# Patient Record
Sex: Male | Born: 1960 | Race: White | Hispanic: No | Marital: Married | Smoking: Never smoker
Health system: Southern US, Community
[De-identification: ages and names within clinical notes are randomized; demographics above are authoritative.]

## PROBLEM LIST (undated history)

## (undated) DIAGNOSIS — E785 Hyperlipidemia, unspecified: Secondary | ICD-10-CM

## (undated) DIAGNOSIS — B269 Mumps without complication: Secondary | ICD-10-CM

## (undated) DIAGNOSIS — L409 Psoriasis, unspecified: Secondary | ICD-10-CM

## (undated) DIAGNOSIS — L570 Actinic keratosis: Secondary | ICD-10-CM

## (undated) DIAGNOSIS — I10 Essential (primary) hypertension: Secondary | ICD-10-CM

## (undated) DIAGNOSIS — M1990 Unspecified osteoarthritis, unspecified site: Secondary | ICD-10-CM

## (undated) DIAGNOSIS — B059 Measles without complication: Secondary | ICD-10-CM

## (undated) DIAGNOSIS — B019 Varicella without complication: Secondary | ICD-10-CM

## (undated) DIAGNOSIS — G4730 Sleep apnea, unspecified: Secondary | ICD-10-CM

## (undated) DIAGNOSIS — M109 Gout, unspecified: Secondary | ICD-10-CM

## (undated) HISTORY — DX: Measles without complication: B05.9

## (undated) HISTORY — PX: CPAP: SHX449

## (undated) HISTORY — DX: Actinic keratosis: L57.0

## (undated) HISTORY — DX: Psoriasis, unspecified: L40.9

## (undated) HISTORY — DX: Mumps without complication: B26.9

## (undated) HISTORY — DX: Unspecified osteoarthritis, unspecified site: M19.90

## (undated) HISTORY — DX: Essential (primary) hypertension: I10

## (undated) HISTORY — PX: OTHER SURGICAL HISTORY: SHX169

## (undated) HISTORY — PX: KNEE SURGERY: SHX244

## (undated) HISTORY — DX: Hyperlipidemia, unspecified: E78.5

## (undated) HISTORY — DX: Varicella without complication: B01.9

---

## 2004-05-17 ENCOUNTER — Ambulatory Visit: Payer: Self-pay | Admitting: Family Medicine

## 2017-08-17 ENCOUNTER — Other Ambulatory Visit: Payer: Self-pay | Admitting: Medical

## 2017-08-17 ENCOUNTER — Encounter: Payer: Self-pay | Admitting: Medical

## 2017-08-17 ENCOUNTER — Ambulatory Visit: Payer: Self-pay | Admitting: Medical

## 2017-08-17 VITALS — BP 161/85 | HR 94 | Temp 100.2°F | Resp 18 | Wt 268.8 lb

## 2017-08-17 DIAGNOSIS — L4050 Arthropathic psoriasis, unspecified: Secondary | ICD-10-CM

## 2017-08-17 MED ORDER — TRAMADOL HCL 50 MG PO TABS: 50.0000 mg | ORAL_TABLET | Freq: Three times a day (TID) | ORAL | 0 refills | Status: DC | PRN

## 2017-08-17 MED ORDER — PREDNISONE 10 MG PO TABS: ORAL_TABLET | ORAL | 0 refills | Status: DC

## 2017-08-17 NOTE — Patient Instructions (Signed)
Psoriasis Psoriasis is a long-term (chronic) condition of skin inflammation. It occurs because your immune system causes skin cells to form too quickly. As a result, too many skin cells grow and create raised, red patches (plaques) that look silvery on your skin. Plaques may appear anywhere on your body. They can be any size or shape. Psoriasis can come and go. The condition varies from mild to very severe. It cannot be passed from one person to another (not contagious). What are the causes? The cause of psoriasis is not known, but certain factors can make the condition worse. These include:  Damage or trauma to the skin, such as cuts, scrapes, sunburn, and dryness.  Lack of sunlight.  Certain medicines.  Alcohol.  Tobacco use.  Stress.  Infections caused by bacteria or viruses.  What increases the risk? This condition is more likely to develop in:  People with a family history of psoriasis.  People who are Caucasian.  People who are between the ages of 15-30 and 50-60 years old.  What are the signs or symptoms? There are five different types of psoriasis. You can have more than one type of psoriasis during your life. Types are:  Plaque.  Guttate.  Inverse.  Pustular.  Erythrodermic.  Each type of psoriasis has different symptoms.  Plaque psoriasis symptoms include red, raised plaques with a silvery white coating (scale). These plaques may be itchy. Your nails may be pitted and crumbly or fall off.  Guttate psoriasis symptoms include small red spots that often show up on your trunk, arms, and legs. These spots may develop after you have been sick, especially with strep throat.  Inverse psoriasis symptoms include plaques in your underarm area, under your breasts, or on your genitals, groin, or buttocks.  Pustular psoriasis symptoms include pus-filled bumps that are painful, red, and swollen on the palms of your hands or the soles of your feet. You also may feel  exhausted, feverish, weak, or have no appetite.  Erythrodermic psoriasis symptoms include bright red skin that may look burned. You may have a fast heartbeat and a body temperature that is too high or too low. You may be itchy or in pain.  How is this diagnosed? Your health care provider may suspect psoriasis based on your symptoms and family history. Your health care provider will also do a physical exam. This may include a procedure to remove a tissue sample (biopsy) for testing. You may also be referred to a health care provider who specializes in skin diseases (dermatologist). How is this treated? There is no cure for this condition, but treatment can help manage it. Goals of treatment include:  Helping your skin heal.  Reducing itching and inflammation.  Slowing the growth of new skin cells.  Helping your immune system respond better to your skin.  Treatment varies, depending on the severity of your condition. Treatment may include:  Creams or ointments.  Ultraviolet ray exposure (light therapy). This may include natural sunlight or light therapy in a medical office.  Medicines (systemic therapy). These medicines can help your body better manage skin cell turnover and inflammation. They may be used along with light therapy or ointments. You may also get antibiotic medicines if you have an infection.  Follow these instructions at home: Skin Care  Moisturize your skin as needed. Only use moisturizers that have been approved by your health care provider.  Apply cool compresses to the affected areas.  Do not scratch your skin. Lifestyle   Do not   use tobacco products. This includes cigarettes, chewing tobacco, and e-cigarettes. If you need help quitting, ask your health care provider.  Drink little or no alcohol.  Try techniques for stress reduction, such as meditation or yoga.  Get exposure to the sun as told by your health care provider. Do not get sunburned.  Consider  joining a psoriasis support group. Medicines  Take or use over-the-counter and prescription medicines only as told by your health care provider.  If you were prescribed an antibiotic, take or use it as told by your health care provider. Do not stop taking the antibiotic even if your condition starts to improve. General instructions  Keep a journal to help track what triggers an outbreak. Try to avoid any triggers.  See a counselor or social worker if feelings of sadness, frustration, and hopelessness about your condition are interfering with your work and relationships.  Keep all follow-up visits as told by your health care provider. This is important. Contact a health care provider if:  Your pain gets worse.  You have increasing redness or warmth in the affected areas.  You have new or worsening pain or stiffness in your joints.  Your nails start to break easily or pull away from the nail bed.  You have a fever.  You feel depressed. This information is not intended to replace advice given to you by your health care provider. Make sure you discuss any questions you have with your health care provider. Document Released: 02/15/2000 Document Revised: 07/26/2015 Document Reviewed: 07/05/2014 Elsevier Interactive Patient Education  2018 [employer]  

## 2017-08-17 NOTE — Progress Notes (Addendum)
Labs wnl.   Subjective:    Patient ID: Omar Wilson, male    DOB: 28-Feb-1961, 57 y.o.   MRN: [id number]  HPI 57 yo male starated with foot pain bilaterally at great toe Joint. Thursday night  Both feet started at the same.  Has previous history of  Gout  In the right great toe  one prior history and  Left elbow  one prior episode times once.. Ate fried oysters and shellfish/ crab dip and drinking beer "I drink socially".[employer]  5 miles on Thursday.  Worse this morining  9.5 /10 if walking   Felt he got dehydated " I can just tell". Ran 5 miles on Thursday next morning they were painful and swollen.  If he is on his Humira he does not usually get episode of gout. He has a history of Psoriatic arthritis.   Review of Systems  Constitutional: Positive for chills (using ice pak on feet. ). Negative for fever.  HENT: Negative for congestion, ear pain and sore throat.   Eyes: Negative for discharge and visual disturbance.  Respiratory: Negative for shortness of breath.   Cardiovascular: Negative for chest pain.  Gastrointestinal: Negative for diarrhea, nausea and vomiting.  Endocrine: Negative for polydipsia, polyphagia and polyuria.  Genitourinary: Negative for dysuria and hematuria.  Musculoskeletal: Positive for arthralgias and joint swelling.  Skin: Positive for color change (erythema of feet).  Allergic/Immunologic: Negative for environmental allergies and food allergies.  Neurological: Negative for dizziness, syncope, light-headedness and headaches.  Hematological: Negative for adenopathy.  Psychiatric/Behavioral: Negative for behavioral problems, self-injury and suicidal ideas.   Out of Humira x 1 month. Last labs  X 1 year.      Objective:   Physical Exam  57 yo male enters walking gingerly into the room. He just moved back to  [place] from [place] (5 yrs ago). Swelling to bilateral Great metatarsal joints, erythema and warmtth. He left his last  Injection of  Humira at his old house.  He has been drinking beer and eating shellfish in celebrations.    Assessment & Plan:  Gout bilateral feet Left>Right Out of Humira Will refer to Rheumatologist.  Ordered CBC Met C and Uric acid labs. Cancelled lab core orders and did a lab order sheet because he does not have [employer]. He says he is going to [facility] at [facility] [address].  Berdine Addison ordered this encounter  Medications  . predniSONE (DELTASONE) 10 MG tablet    Sig: Take 6Tablets by mouth  Today then 5 tablets tomorrow , then one tablet less each day thereafter, take with food.    Dispense:  21 tablet    Refill:  0  . traMADol (ULTRAM) 50 MG tablet    Sig: Take 1 tablet (50 mg total) by mouth every 8 (eight) hours as needed.    Dispense:  15 tablet    Refill:  0  Did not print  So hand wrote for Tramadol prescription 50 mg one every 8 hours as needed for pain  #15 no refill Copy for chart.  Declines crutches or work note.Elevate and wear loose shoes.  Patient verbalizes understanding and has no questions at [place]. He states because it has gotten worse he has stopped beer intake since yesterday. Return to the clinic in 3-5 days if not improving, elevate feet and limit walking.  Patient verbalizes understanding and has no questions at discharge.

## 2017-08-18 ENCOUNTER — Ambulatory Visit: Payer: Self-pay | Admitting: Medical

## 2017-08-18 NOTE — Addendum Note (Signed)
Addended by: Daryll Drown R on: 08/18/2017 04:38 PM   Modules accepted: Orders

## 2017-08-19 ENCOUNTER — Telehealth: Payer: Self-pay | Admitting: Medical

## 2017-08-19 LAB — COMPREHENSIVE METABOLIC PANEL
ALBUMIN: 4.3 g/dL (ref 3.5–5.5)
ALT: 19 IU/L (ref 0–44)
AST: 18 IU/L (ref 0–40)
Albumin/Globulin Ratio: 1.5 (ref 1.2–2.2)
Alkaline Phosphatase: 97 IU/L (ref 39–117)
BILIRUBIN TOTAL: 0.8 mg/dL (ref 0.0–1.2)
BUN / CREAT RATIO: 11 (ref 9–20)
BUN: 12 mg/dL (ref 6–24)
CO2: 23 mmol/L (ref 20–29)
Calcium: 9.5 mg/dL (ref 8.7–10.2)
Chloride: 101 mmol/L (ref 96–106)
Creatinine, Ser: 1.13 mg/dL (ref 0.76–1.27)
GFR calc Af Amer: 82 mL/min/{1.73_m2} (ref >59)
GFR calc non Af Amer: 71 mL/min/{1.73_m2} (ref >59)
GLOBULIN, TOTAL: 2.9 g/dL (ref 1.5–4.5)
Glucose: 88 mg/dL (ref 65–99)
POTASSIUM: 4.6 mmol/L (ref 3.5–5.2)
SODIUM: 142 mmol/L (ref 134–144)
Total Protein: 7.2 g/dL (ref 6.0–8.5)

## 2017-08-19 LAB — CBC WITH DIFFERENTIAL/PLATELET
BASOS ABS: 0 10*3/uL (ref 0.0–0.2)
Basos: 0 %
EOS (ABSOLUTE): 0.1 10*3/uL (ref 0.0–0.4)
Eos: 1 %
HEMATOCRIT: 46.8 % (ref 37.5–51.0)
Hemoglobin: 16.2 g/dL (ref 13.0–17.7)
IMMATURE GRANULOCYTES: 0 %
Immature Grans (Abs): 0 10*3/uL (ref 0.0–0.1)
LYMPHS ABS: 1.4 10*3/uL (ref 0.7–3.1)
Lymphs: 16 %
MCH: 33.3 pg — ABNORMAL HIGH (ref 26.6–33.0)
MCHC: 34.6 g/dL (ref 31.5–35.7)
MCV: 96 fL (ref 79–97)
MONOS ABS: 0.7 10*3/uL (ref 0.1–0.9)
Monocytes: 8 %
NEUTROS PCT: 75 %
Neutrophils Absolute: 6.5 10*3/uL (ref 1.4–7.0)
PLATELETS: 307 10*3/uL (ref 150–450)
RBC: 4.86 x10E6/uL (ref 4.14–5.80)
RDW: 14.7 % (ref 12.3–15.4)
WBC: 8.7 10*3/uL (ref 3.4–10.8)

## 2017-08-19 LAB — URIC ACID: URIC ACID: 6.6 mg/dL (ref 3.7–8.6)

## 2017-08-19 NOTE — Telephone Encounter (Signed)
Called patient ot review labs. Uric acid  6.6.  Will offer colchicine on a short term bases per Dr. Marlan Palau recommendations Asked patient to call office.

## 2018-02-25 ENCOUNTER — Other Ambulatory Visit: Payer: Self-pay | Admitting: Internal Medicine

## 2018-02-25 DIAGNOSIS — M25461 Effusion, right knee: Secondary | ICD-10-CM

## 2018-03-01 ENCOUNTER — Other Ambulatory Visit
Admission: RE | Admit: 2018-03-01 | Discharge: 2018-03-01 | Disposition: A | Discharge status: Home / Self Care | Payer: Self-pay | Source: Ambulatory Visit | Attending: Sports Medicine | Admitting: Sports Medicine

## 2018-03-01 DIAGNOSIS — G8929 Other chronic pain: Secondary | ICD-10-CM | POA: Insufficient documentation

## 2018-03-01 DIAGNOSIS — M25561 Pain in right knee: Secondary | ICD-10-CM | POA: Insufficient documentation

## 2018-03-01 DIAGNOSIS — M25461 Effusion, right knee: Secondary | ICD-10-CM | POA: Insufficient documentation

## 2018-03-01 LAB — SYNOVIAL CELL COUNT + DIFF, W/ CRYSTALS
EOSINOPHILS-SYNOVIAL: 0 %
Lymphocytes-Synovial Fld: 20 %
Monocyte-Macrophage-Synovial Fluid: 11 %
NEUTROPHIL, SYNOVIAL: 69 %
WBC, SYNOVIAL: 1438 /mm3 — AB (ref 0–200)

## 2018-03-05 LAB — BODY FLUID CULTURE: CULTURE: NO GROWTH

## 2018-03-07 ENCOUNTER — Other Ambulatory Visit: Payer: Self-pay

## 2018-03-08 ENCOUNTER — Ambulatory Visit
Admission: RE | Admit: 2018-03-08 | Discharge: 2018-03-08 | Disposition: A | Discharge status: Home / Self Care | Payer: BC Managed Care – PPO | Source: Ambulatory Visit | Attending: Internal Medicine | Admitting: Internal Medicine

## 2018-03-08 DIAGNOSIS — M25461 Effusion, right knee: Secondary | ICD-10-CM

## 2018-09-03 ENCOUNTER — Other Ambulatory Visit: Payer: Self-pay | Admitting: *Deleted

## 2018-09-03 DIAGNOSIS — Z20822 Contact with and (suspected) exposure to covid-19: Secondary | ICD-10-CM

## 2018-09-06 ENCOUNTER — Other Ambulatory Visit: Payer: Self-pay

## 2018-09-06 DIAGNOSIS — Z20822 Contact with and (suspected) exposure to covid-19: Secondary | ICD-10-CM

## 2018-09-11 LAB — NOVEL CORONAVIRUS, NAA: SARS-CoV-2, NAA: NOT DETECTED

## 2019-01-05 ENCOUNTER — Ambulatory Visit: Payer: Self-pay

## 2019-01-05 ENCOUNTER — Other Ambulatory Visit: Payer: Self-pay

## 2019-01-05 DIAGNOSIS — Z23 Encounter for immunization: Secondary | ICD-10-CM

## 2019-06-22 ENCOUNTER — Ambulatory Visit: Payer: BC Managed Care – PPO | Admitting: Dermatology

## 2019-06-22 ENCOUNTER — Other Ambulatory Visit: Payer: Self-pay

## 2019-06-22 DIAGNOSIS — D229 Melanocytic nevi, unspecified: Secondary | ICD-10-CM

## 2019-06-22 DIAGNOSIS — L570 Actinic keratosis: Secondary | ICD-10-CM | POA: Diagnosis not present

## 2019-06-22 DIAGNOSIS — L820 Inflamed seborrheic keratosis: Secondary | ICD-10-CM

## 2019-06-22 DIAGNOSIS — L578 Other skin changes due to chronic exposure to nonionizing radiation: Secondary | ICD-10-CM

## 2019-06-22 DIAGNOSIS — L814 Other melanin hyperpigmentation: Secondary | ICD-10-CM

## 2019-06-22 DIAGNOSIS — Z1283 Encounter for screening for malignant neoplasm of skin: Secondary | ICD-10-CM

## 2019-06-22 DIAGNOSIS — L409 Psoriasis, unspecified: Secondary | ICD-10-CM | POA: Diagnosis not present

## 2019-06-22 DIAGNOSIS — L821 Other seborrheic keratosis: Secondary | ICD-10-CM | POA: Diagnosis not present

## 2019-06-22 MED ORDER — ENSTILAR 0.005-0.064 % EX FOAM: 1.0000 "application " | Freq: Two times a day (BID) | CUTANEOUS | 3 refills | Status: DC

## 2019-06-22 NOTE — Progress Notes (Signed)
Follow-Up Visit   Subjective  Omar Wilson is a 59 y.o. male who presents for the following: Annual Exam (yearly body exam ), Actinic Keratosis (3 months f/u Ak on face, arms, past treatment LN2, [place] ), and Psoriasis (f/u on Psoriasis on the lower legs, using Enstilar foam with a good response). Patient presents for skin cancer screening and mole check and total-body skin exam today.  The following portions of the chart were reviewed this encounter and updated as appropriate:  Tobacco  Allergies  Meds  Problems  Med Hx  Surg Hx  Fam Hx      Review of Systems:  No other skin or systemic complaints except as noted in HPI or Assessment and Plan.  Objective  Well appearing patient in no apparent distress; mood and affect are within normal limits.  A full examination was performed including scalp, head, eyes, ears, nose, lips, neck, chest, axillae, abdomen, back, buttocks, bilateral upper extremities, bilateral lower extremities, hands, feet, fingers, toes, fingernails, and toenails. All findings within normal limits unless otherwise noted below.  Objective  R mid helix, face, arms (21): Erythematous thin papules/macules with gritty scale.   Objective  arms, hands (16): Erythematous keratotic or waxy stuck-on papule or plaque.   Objective  lower legs: Pink scaly guttate patches of the legs    Assessment & Plan    Skin cancer screening performed today.  Seborrheic Keratoses - Stuck-on, waxy, tan-brown papules and plaques  - Discussed benign etiology and prognosis. - Observe - Call for any changes Melanocytic Nevi - Tan-brown and/or pink-flesh-colored symmetric macules and papules - Benign appearing on exam today - Observation - Call clinic for new or changing moles - Recommend daily use of broad spectrum spf 30+ sunscreen to sun-exposed areas.  Actinic Damage - diffuse scaly erythematous macules with underlying dyspigmentation - Recommend daily broad spectrum  sunscreen SPF 30+ to sun-exposed areas, reapply every 2 hours as needed.  - Call for new or changing lesions. Lentigines - Scattered tan macules - Discussed due to sun exposure - Benign, observe - Call for any changes   AK (actinic keratosis) (21) R mid helix, face, arms  Recheck R mid helix at the next office visit if not gone may consider biopsy.   Destruction of lesion - R mid helix, face, arms Complexity: simple   Destruction method: cryotherapy   Informed consent: discussed and consent obtained   Timeout:  patient name, date of birth, surgical site, and procedure verified Lesion destroyed using liquid nitrogen: Yes   Region frozen until ice ball extended beyond lesion: Yes   Outcome: patient tolerated procedure well with no complications   Post-procedure details: wound care instructions given    Inflamed seborrheic keratosis (16) arms, hands  Destruction of lesion - arms, hands Complexity: simple   Destruction method: cryotherapy   Informed consent: discussed and consent obtained   Timeout:  patient name, date of birth, surgical site, and procedure verified Lesion destroyed using liquid nitrogen: Yes   Region frozen until ice ball extended beyond lesion: Yes   Outcome: patient tolerated procedure well with no complications   Post-procedure details: wound care instructions given    Psoriasis lower legs  Guttate Psoriasis vs ISK  Cont using Enstilar foam qd-bid   May consider biopsy in the future   Ordered Medications: Calcipotriene-Betameth Diprop (ENSTILAR) 0.005-0.064 % FOAM  Return in about 2 months (around 08/22/2019) for Recheck R mid helix,  PDT arms, hands x 4 weeks, PDT face x  6 weeks .   IMarye Round, CMA, am acting as scribe for Sarina Ser, MD .  Documentation: I have reviewed the above documentation for accuracy and completeness, and I agree with the above.  Sarina Ser, MD

## 2019-06-22 NOTE — Patient Instructions (Signed)

## 2019-06-23 ENCOUNTER — Encounter: Payer: Self-pay | Admitting: Dermatology

## 2019-07-20 ENCOUNTER — Ambulatory Visit: Payer: BC Managed Care – PPO

## 2019-07-20 ENCOUNTER — Other Ambulatory Visit: Payer: Self-pay

## 2019-07-20 DIAGNOSIS — L570 Actinic keratosis: Secondary | ICD-10-CM | POA: Diagnosis not present

## 2019-07-20 MED ORDER — AMINOLEVULINIC ACID HCL 20 % EX SOLR
2.0000 "application " | Freq: Once | CUTANEOUS | Status: AC
  Administered 2019-07-20: 708 mg via TOPICAL

## 2019-07-20 NOTE — Patient Instructions (Signed)

## 2019-07-20 NOTE — Progress Notes (Signed)
Patient completed PDT therapy today.  AK (actinic keratosis) (4) Left Forearm - Posterior; Right Forearm - Posterior; Left Dorsal Hand; Right Dorsal Hand  Photodynamic therapy - Left Dorsal Hand, Left Forearm - Posterior, Right Dorsal Hand, Right Forearm - Posterior Procedure discussed: discussed risks, benefits, side effects. and alternatives   Prep: site scrubbed/prepped with acetone   Number of lesions:  Multiple Type of treatment:  Blue light Aminolevulinic Acid (see MAR for details): Levulan Number of Levulan sticks used:  2 Incubation time (minutes):  120 Number of minutes under lamp:  16 Number of seconds under lamp:  40 Cooling:  Floor fan Outcome: patient tolerated procedure well with no complications   Post-procedure details: sunscreen applied    Aminolevulinic Acid HCl 20 % SOLR 708 mg - Left Dorsal Hand, Left Forearm - Posterior, Right Dorsal Hand, Right Forearm - Posterior

## 2019-08-03 ENCOUNTER — Ambulatory Visit: Payer: BC Managed Care – PPO

## 2019-08-03 ENCOUNTER — Other Ambulatory Visit: Payer: Self-pay

## 2019-08-03 DIAGNOSIS — L570 Actinic keratosis: Secondary | ICD-10-CM

## 2019-08-03 MED ORDER — AMINOLEVULINIC ACID HCL 20 % EX SOLR
1.0000 "application " | Freq: Once | CUTANEOUS | Status: AC
  Administered 2019-08-03: 354 mg via TOPICAL

## 2019-08-03 NOTE — Progress Notes (Signed)
Patient completed PDT therapy today.  AK (actinic keratosis) Head - Anterior (Face)  Photodynamic therapy - Head - Anterior (Face) Procedure discussed: discussed risks, benefits, side effects. and alternatives   Prep: site scrubbed/prepped with acetone   Location:  Face Number of lesions:  Multiple Type of treatment:  Blue light Aminolevulinic Acid (see MAR for details): Levulan Number of Levulan sticks used:  1 Incubation time (minutes):  60 Number of minutes under lamp:  16 Number of seconds under lamp:  40 Cooling:  Floor fan Outcome: patient tolerated procedure well with no complications   Post-procedure details: sunscreen applied    Aminolevulinic Acid HCl 20 % SOLR 354 mg - Head - Anterior (Face)

## 2019-08-03 NOTE — Patient Instructions (Signed)

## 2019-09-21 ENCOUNTER — Ambulatory Visit: Payer: BC Managed Care – PPO | Admitting: Dermatology

## 2019-09-21 ENCOUNTER — Other Ambulatory Visit: Payer: Self-pay

## 2019-09-21 DIAGNOSIS — L570 Actinic keratosis: Secondary | ICD-10-CM

## 2019-09-21 DIAGNOSIS — L820 Inflamed seborrheic keratosis: Secondary | ICD-10-CM

## 2019-09-21 DIAGNOSIS — L578 Other skin changes due to chronic exposure to nonionizing radiation: Secondary | ICD-10-CM | POA: Diagnosis not present

## 2019-09-21 NOTE — Progress Notes (Signed)
   Follow-Up Visit   Subjective  Hal H Temme is a 59 y.o. male who presents for the following: Follow-up (Ak follow up LN2 x 21 06/22/2019, PDT to arms and face. Recheck right mid helix).  The following portions of the chart were reviewed this encounter and updated as appropriate:  Tobacco  Allergies  Meds  Problems  Med Hx  Surg Hx  Fam Hx     Review of Systems:  No other skin or systemic complaints except as noted in HPI or Assessment and Plan.  Objective  Well appearing patient in no apparent distress; mood and affect are within normal limits.  A focused examination was performed including face, arms, hands. Relevant physical exam findings are noted in the Assessment and Plan.  Objective  Arms, face, ears (15): Erythematous thin papules/macules with gritty scale.   Objective  Arms, face (9): Erythematous keratotic or waxy stuck-on papule or plaque.    Assessment & Plan    Actinic Damage - diffuse scaly erythematous macules with underlying dyspigmentation - Recommend daily broad spectrum sunscreen SPF 30+ to sun-exposed areas, reapply every 2 hours as needed.  - Call for new or changing lesions.   AK (actinic keratosis) (15) Arms, face, ears  Destruction of lesion - Arms, face, ears Complexity: simple   Destruction method: cryotherapy   Informed consent: discussed and consent obtained   Timeout:  patient name, date of birth, surgical site, and procedure verified Lesion destroyed using liquid nitrogen: Yes   Region frozen until ice ball extended beyond lesion: Yes   Outcome: patient tolerated procedure well with no complications   Post-procedure details: wound care instructions given    Inflamed seborrheic keratosis (9) Arms, face  Destruction of lesion - Arms, face Complexity: simple   Destruction method: cryotherapy   Informed consent: discussed and consent obtained   Timeout:  patient name, date of birth, surgical site, and procedure verified Lesion  destroyed using liquid nitrogen: Yes   Region frozen until ice ball extended beyond lesion: Yes   Outcome: patient tolerated procedure well with no complications   Post-procedure details: wound care instructions given    Return in about 6 months (around 03/23/2020).   I, Ashok Cordia, CMA, am acting as scribe for Sarina Ser, MD .  Documentation: I have reviewed the above documentation for accuracy and completeness, and I agree with the above.  Sarina Ser, MD

## 2019-09-21 NOTE — Patient Instructions (Signed)

## 2019-09-24 ENCOUNTER — Encounter: Payer: Self-pay | Admitting: Dermatology

## 2019-10-03 IMAGING — MR MR KNEE*R* W/O CM
7 series · 37 of 40 positions shown · non-contrast
Comparison: None.

CLINICAL DATA: Right knee pain and swelling for 6 months

EXAM:
MRI OF THE RIGHT KNEE WITHOUT CONTRAST
TECHNIQUE: Multiplanar, multisequence MR imaging of the knee was performed. No
intravenous contrast was administered.

[Series 8: T2 fat-sat · axial · right · 4.0mm · 0.50mm/px · z∈[-92,+61]mm · 5 of 36 slices shown (1 of 3)]
[im 1/36]
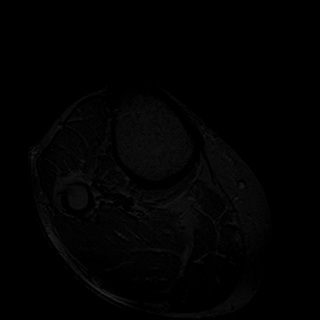
[im 9/36]
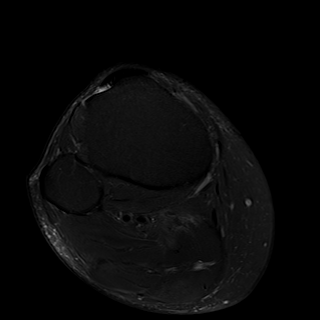
[im 18/36]
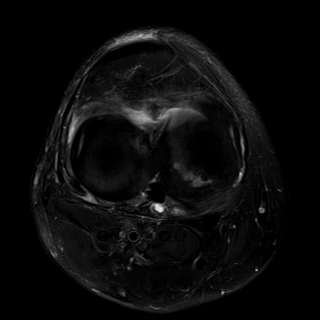
[im 27/36]
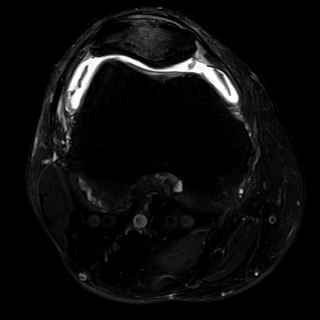
[im 36/36]
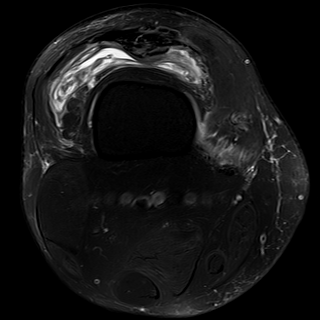

[Series 9: T2 fat-sat · coronal · right · 4.0mm · 0.39mm/px · 6 of 31 slices shown (2 of 3)]
[im 1/31]
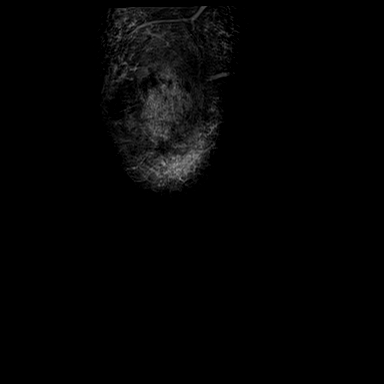
[im 7/31]
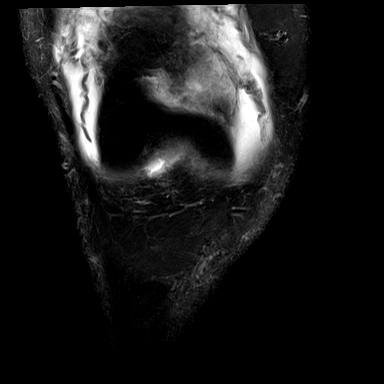
[im 13/31]
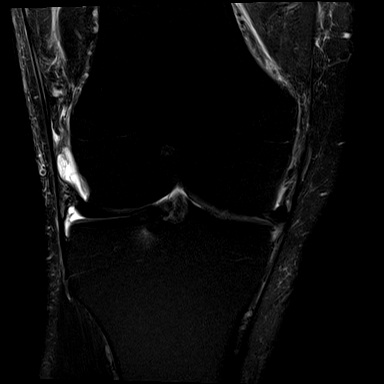
[im 19/31]
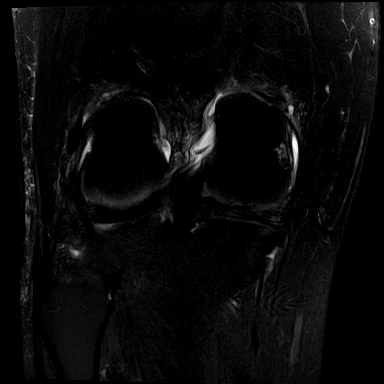
[im 25/31]
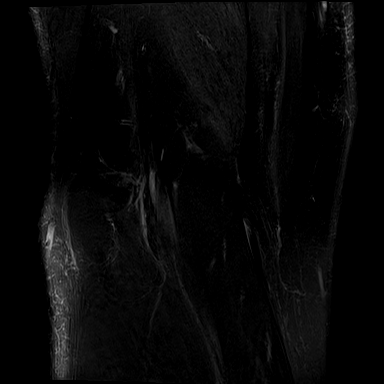
[im 31/31]
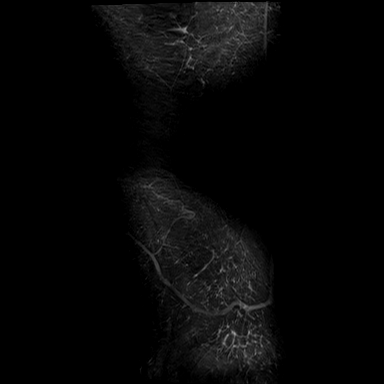

[Series 10: T1 · coronal · right · 4.0mm · 0.39mm/px · 3 of 31 slices shown]
[im 1/31]
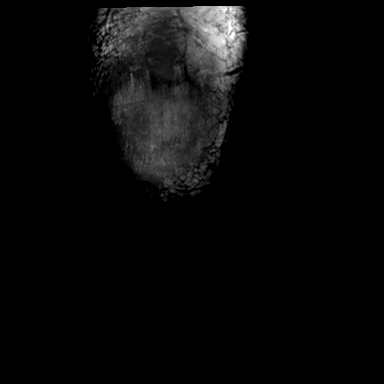
[im 7/31]
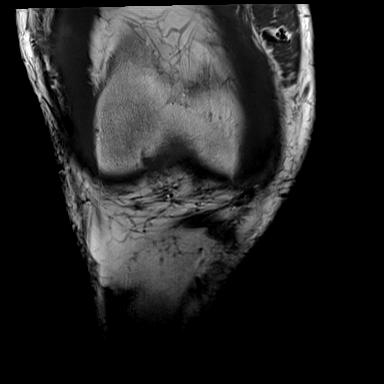
[im 13/31]
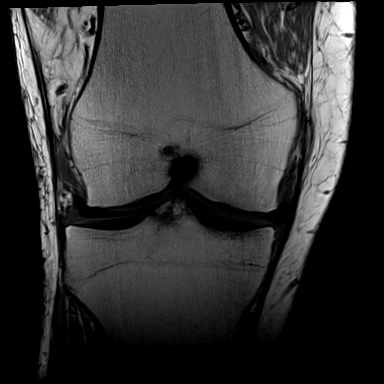

[Series 11: PD fat-sat · coronal · right · 3.0mm · 0.47mm/px · 7 of 40 slices shown (1 of 2)]
[im 1/40]
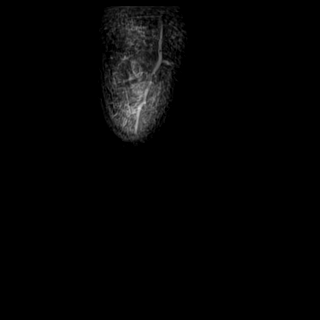
[im 7/40]
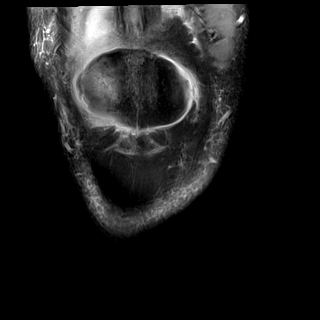
[im 14/40]
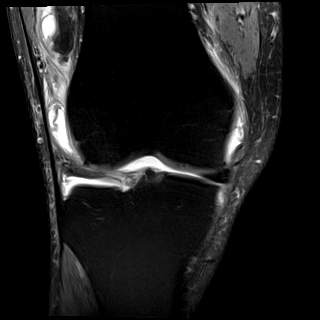
[im 20/40]
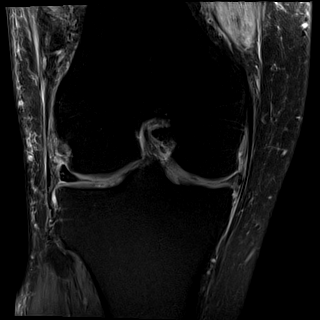
[im 27/40]
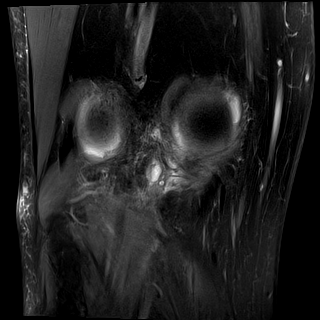
[im 33/40]
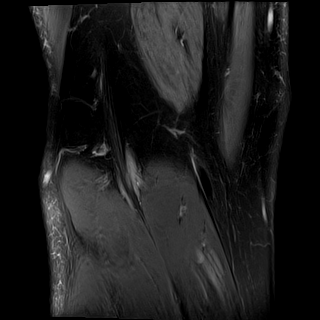
[im 40/40]
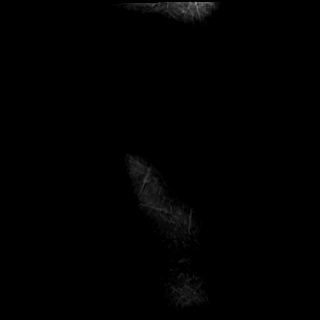

[Series 12: PD fat-sat · sagittal · right · 3.0mm · 0.39mm/px · 6 of 32 slices shown (2 of 2)]
[im 1/32]
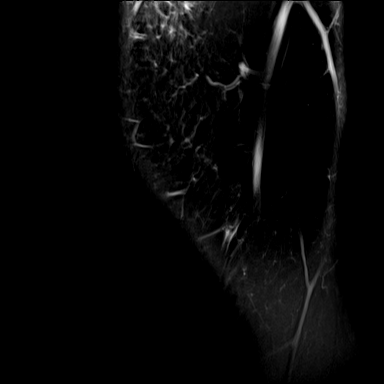
[im 7/32]
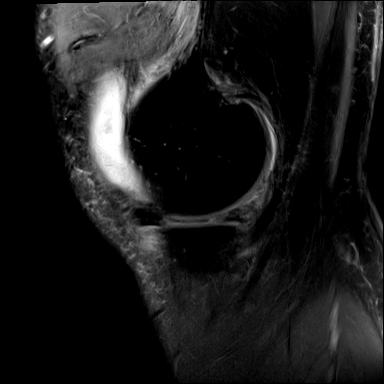
[im 13/32]
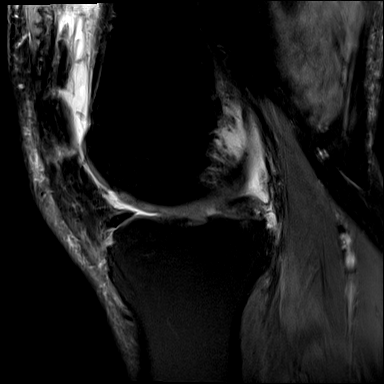
[im 19/32]
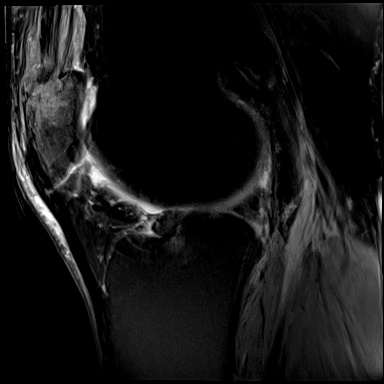
[im 25/32]
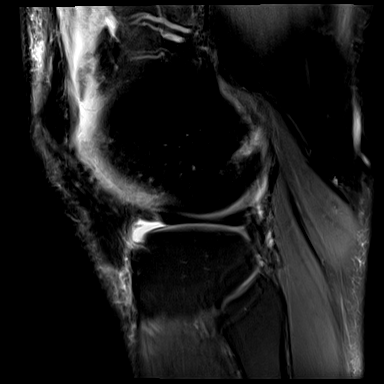
[im 32/32]
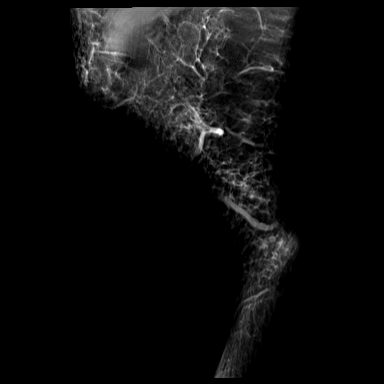

[Series 13: T2 fat-sat · sagittal · right · 3.0mm · 0.39mm/px · 6 of 32 slices shown (3 of 3)]
[im 1/32]
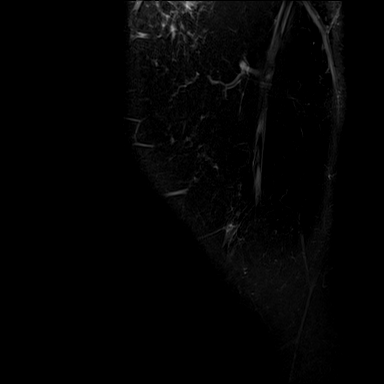
[im 7/32]
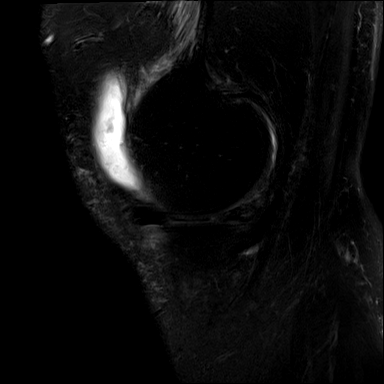
[im 13/32]
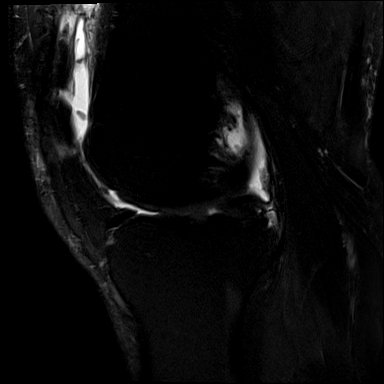
[im 19/32]
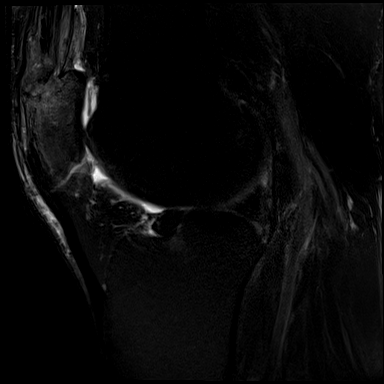
[im 25/32]
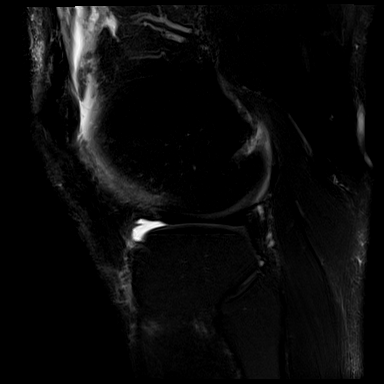
[im 32/32]
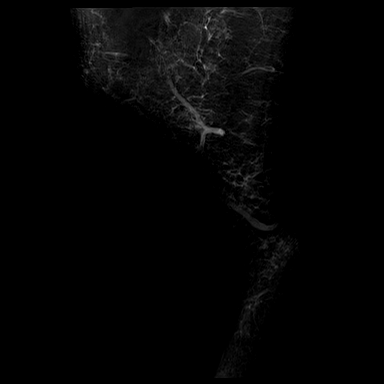

[Series 14: PD · coronal · right · 1.5mm · 0.44mm/px · 4 of 21 slices shown]
[im 1/21]
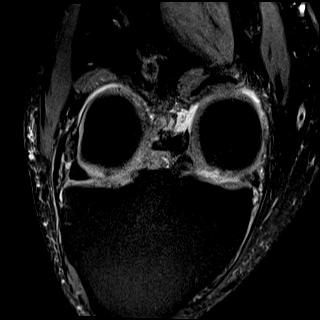
[im 7/21]
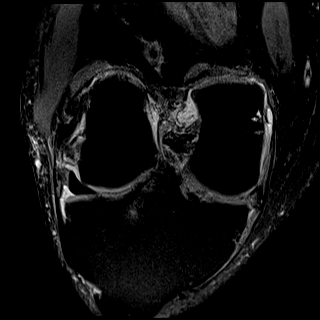
[im 14/21]
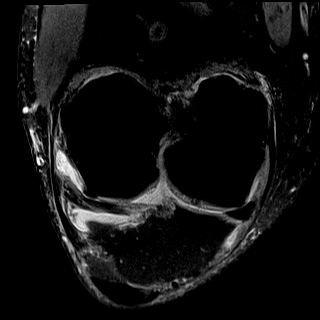
[im 21/21]
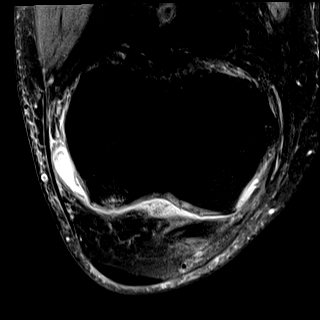

[37 of 40 positions shown; findings below may reference images not displayed]

FINDINGS: MENISCI

Medial meniscus: Severe complex tear of posterior horn and body of
medial meniscus with a radial component and peripheral meniscal
extrusion.

Lateral meniscus:  Intact.

LIGAMENTS

Cruciates:  Intact ACL and PCL.

Collaterals: Medial collateral ligament is intact. Lateral
collateral ligament complex is intact.

CARTILAGE

Patellofemoral: Partial-thickness cartilage loss of lateral patellar
facet.

Medial: Partial-thickness cartilage loss of the medial femorotibial
compartment with areas of full-thickness cartilage loss of the
medial femoral condyle.

Lateral:  Cartilage fissuring of lateral tibial plateau.

Joint: Small joint effusion. Normal Hoffa's fat. No plical
thickening.

Popliteal Fossa:  No Baker's cyst.  Intact popliteus tendon.

Extensor Mechanism: Severe focal tendinosis of the central portion
of the quadriceps tendon with a small interstitial tear and severe
subcortical reactive marrow edema within patella. Intact patellar
tendon. Intact medial patellar retinaculum. Intact lateral patellar
retinaculum. Intact MPFL.

Bones: No other marrow signal abnormality. No fracture or
dislocation.

Other: No fluid collection or hematoma.  Muscles are normal.
IMPRESSION: 1. Severe complex tear of posterior horn and body of medial meniscus
with a radial component and peripheral meniscal extrusion.
2. Severe focal tendinosis of the central portion of the quadriceps
tendon with a small interstitial tear and severe subcortical
reactive marrow edema within patella.
3. Tricompartmental cartilage abnormalities as described above.

## 2020-03-21 ENCOUNTER — Encounter: Payer: Self-pay | Admitting: Dermatology

## 2020-03-21 ENCOUNTER — Ambulatory Visit: Payer: BC Managed Care – PPO | Admitting: Dermatology

## 2020-03-21 ENCOUNTER — Other Ambulatory Visit: Payer: Self-pay

## 2020-03-21 DIAGNOSIS — L578 Other skin changes due to chronic exposure to nonionizing radiation: Secondary | ICD-10-CM | POA: Diagnosis not present

## 2020-03-21 DIAGNOSIS — L853 Xerosis cutis: Secondary | ICD-10-CM | POA: Diagnosis not present

## 2020-03-21 DIAGNOSIS — L570 Actinic keratosis: Secondary | ICD-10-CM | POA: Diagnosis not present

## 2020-03-21 DIAGNOSIS — L820 Inflamed seborrheic keratosis: Secondary | ICD-10-CM

## 2020-03-21 DIAGNOSIS — L821 Other seborrheic keratosis: Secondary | ICD-10-CM

## 2020-03-21 NOTE — Progress Notes (Unsigned)
   Follow-Up Visit   Subjective  Omar Wilson is a 60 y.o. male who presents for the following: Actinic Keratosis (Face and arms - recheck for new or persistent skin lesions). Irritated skin lesions on the hands.   The following portions of the chart were reviewed this encounter and updated as appropriate:   Tobacco  Allergies  Meds  Problems  Med Hx  Surg Hx  Fam Hx     Review of Systems:  No other skin or systemic complaints except as noted in HPI or Assessment and Plan.  Objective  Well appearing patient in no apparent distress; mood and affect are within normal limits.  A focused examination was performed including sun exposed areas. Relevant physical exam findings are noted in the Assessment and Plan.  Objective  Face (8): Erythematous thin papules/macules with gritty scale.   Objective  Hands x 3: Erythematous keratotic or waxy stuck-on papule or plaque.   Assessment & Plan  AK (actinic keratosis) (8) Face  Destruction of lesion - Face Complexity: simple   Destruction method: cryotherapy   Informed consent: discussed and consent obtained   Timeout:  patient name, date of birth, surgical site, and procedure verified Lesion destroyed using liquid nitrogen: Yes   Region frozen until ice ball extended beyond lesion: Yes   Outcome: patient tolerated procedure well with no complications   Post-procedure details: wound care instructions given    Inflamed seborrheic keratosis Hands x 3  Destruction of lesion - Hands x 3 Complexity: simple   Destruction method: cryotherapy   Informed consent: discussed and consent obtained   Timeout:  patient name, date of birth, surgical site, and procedure verified Lesion destroyed using liquid nitrogen: Yes   Region frozen until ice ball extended beyond lesion: Yes   Outcome: patient tolerated procedure well with no complications   Post-procedure details: wound care instructions given     Actinic Damage - Chronic, secondary  to cumulative UV radiation exposure/sun exposure over time - Diffuse scaly erythematous macules with underlying dyspigmentation - Recommend daily broad spectrum sunscreen SPF 30+ to sun-exposed areas, reapply every 2 hours as needed.  - Call for new or changing lesions.  Seborrheic Keratoses - Stuck-on, waxy, tan-brown papules and plaques  - Discussed benign etiology and prognosis. - Observe - Call for any changes  Xerosis - Diffuse xerotic patches - Recommend gentle, hydrating skin care - Gentle skin care handout given - Recommend Amlactin Rapid Relief moisturizer daily.  Return in about 6 months (around 09/18/2020) for AK recheck .  Luther Redo, CMA, am acting as scribe for Sarina Ser, MD .  Documentation: I have reviewed the above documentation for accuracy and completeness, and I agree with the above.  Sarina Ser, MD

## 2020-03-25 ENCOUNTER — Encounter: Payer: Self-pay | Admitting: Dermatology

## 2020-08-09 ENCOUNTER — Other Ambulatory Visit: Payer: Self-pay | Admitting: Family Medicine

## 2020-08-09 DIAGNOSIS — N5089 Other specified disorders of the male genital organs: Secondary | ICD-10-CM

## 2020-08-13 ENCOUNTER — Ambulatory Visit
Admission: RE | Admit: 2020-08-13 | Discharge: 2020-08-13 | Disposition: A | Discharge status: Home / Self Care | Payer: BC Managed Care – PPO | Source: Ambulatory Visit | Attending: Family Medicine | Admitting: Family Medicine

## 2020-08-13 ENCOUNTER — Other Ambulatory Visit: Payer: Self-pay

## 2020-08-13 DIAGNOSIS — N5089 Other specified disorders of the male genital organs: Secondary | ICD-10-CM | POA: Diagnosis not present

## 2020-08-30 ENCOUNTER — Ambulatory Visit: Payer: BC Managed Care – PPO | Admitting: Dermatology

## 2020-08-30 ENCOUNTER — Other Ambulatory Visit: Payer: Self-pay

## 2020-08-30 DIAGNOSIS — L821 Other seborrheic keratosis: Secondary | ICD-10-CM

## 2020-08-30 DIAGNOSIS — L570 Actinic keratosis: Secondary | ICD-10-CM | POA: Diagnosis not present

## 2020-08-30 DIAGNOSIS — L578 Other skin changes due to chronic exposure to nonionizing radiation: Secondary | ICD-10-CM

## 2020-08-30 DIAGNOSIS — L814 Other melanin hyperpigmentation: Secondary | ICD-10-CM | POA: Diagnosis not present

## 2020-08-30 NOTE — Progress Notes (Signed)
Follow-Up Visit   Subjective  Hal H Fury is a 60 y.o. male who presents for the following: Actinic Keratosis (Face and arms. Patient has noticed a few scaly spots on his face. PDT to the arms and face in the past. No history of skin cancer.).  The following portions of the chart were reviewed this encounter and updated as appropriate:   Tobacco  Allergies  Meds  Problems  Med Hx  Surg Hx  Fam Hx      Review of Systems:  No other skin or systemic complaints except as noted in HPI or Assessment and Plan.  Objective  Well appearing patient in no apparent distress; mood and affect are within normal limits.  A focused examination was performed including face, arms. Relevant physical exam findings are noted in the Assessment and Plan.  face, arms, ears (8) Erythematous thin papules/macules with gritty scale.    Assessment & Plan  Lentigines - Scattered tan macules - Due to sun exposure - Benign-appering, observe - Recommend daily broad spectrum sunscreen SPF 30+ to sun-exposed areas, reapply every 2 hours as needed. - Call for any changes Seborrheic Keratoses - Stuck-on, waxy, tan-brown papules and/or plaques  - Benign-appearing - Discussed benign etiology and prognosis. - Observe - Call for any changes  AK (actinic keratosis) (8) face, arms, ears -treated with LN2 destruction today. Will start field treatment with topical treatment for nonhypertrophic lesions across the forehead and temples.  Actinic keratoses are precancerous spots that appear secondary to cumulative UV radiation exposure/sun exposure over time. They are chronic with expected duration over 1 year. A portion of actinic keratoses will progress to squamous cell carcinoma of the skin. It is not possible to reliably predict which spots will progress to skin cancer and so treatment is recommended to prevent development of skin cancer.  Recommend daily broad spectrum sunscreen SPF 30+ to sun-exposed areas,  reapply every 2 hours as needed.  Recommend staying in the shade or wearing long sleeves, sun glasses (UVA+UVB protection) and wide brim hats (4-inch brim around the entire circumference of the hat). Call for new or changing lesions.  Actinic Damage - Severe, confluent actinic changes with pre-cancerous actinic keratoses  - Severe, chronic, not at goal, secondary to cumulative UV radiation exposure over time - diffuse scaly erythematous macules and papules with underlying dyspigmentation - Discussed Prescription "Field Treatment" for Severe, Chronic Confluent Actinic Changes with Pre-Cancerous Actinic Keratoses Field treatment involves treatment of an entire area of skin that has confluent Actinic Changes (Sun/ Ultraviolet light damage) and PreCancerous Actinic Keratoses by method of PhotoDynamic Therapy (PDT) and/or prescription Topical Chemotherapy agents such as 5-fluorouracil, 5-fluorouracil/calcipotriene, and/or imiquimod.  The purpose is to decrease the number of clinically evident and subclinical PreCancerous lesions to prevent progression to development of skin cancer by chemically destroying early precancer changes that may or may not be visible.  It has been shown to reduce the risk of developing skin cancer in the treated area. As a result of treatment, redness, scaling, crusting, and open sores may occur during treatment course. One or more than one of these methods may be used and may have to be used several times to control, suppress and eliminate the PreCancerous changes. Discussed treatment course, expected reaction, and possible side effects. - Recommend daily broad spectrum sunscreen SPF 30+ to sun-exposed areas, reapply every 2 hours as needed.  - Staying in the shade or wearing long sleeves, sun glasses (UVA+UVB protection) and wide brim hats (4-inch brim around  the entire circumference of the hat) are also recommended. - Call for new or changing lesions.  Start Fluorouracil: 5%  Calcipotriene: 0.005% Apply to forehead, temples, ears BID x 1 wk dsp 30g 1Rf. Rx sent to [facility].  5-fluorouracil/calcipotriene cream is is a type of field treatment used to treat precancers, thin skin cancers, and areas of sun damage. Reviewed expected reaction including irritation and mild inflammation potentially progressing to more severe inflammation including redness, scaling, crusting and open sores/erosions.  Reviewed if too much irritation occurs, ensure application of only a thin layer and decrease frequency of use to achieve a tolerable level of inflammation. Recommend applying Vaseline ointment to open sores as needed.  Minimize sun exposure while under treatment. Recommend daily broad spectrum sunscreen SPF 30+ to sun-exposed areas, reapply every 2 hours as needed.   Destruction of lesion - face, arms, ears Complexity: simple   Destruction method: cryotherapy   Informed consent: discussed and consent obtained   Timeout:  patient name, date of birth, surgical site, and procedure verified Lesion destroyed using liquid nitrogen: Yes   Region frozen until ice ball extended beyond lesion: Yes   Outcome: patient tolerated procedure well with no complications   Post-procedure details: wound care instructions given   Additional details:  Prior to procedure, discussed risks of blister formation, small wound, skin dyspigmentation, or rare scar following cryotherapy. Recommend Vaseline ointment to treated areas while healing.  Return for AKs.  39-month follow-up  I, Jamesetta Orleans, CMA, am acting as scribe for Sarina Ser, MD .  Documentation: I have reviewed the above documentation for accuracy and completeness, and I agree with the above.  Sarina Ser, MD

## 2020-08-30 NOTE — Patient Instructions (Addendum)
Cryotherapy Aftercare  Wash gently with soap and water everyday.   Apply Vaseline and Band-Aid daily until healed.    Instructions for [facility] Medications  One or more of your medications was sent to the [facility] mail order compounding pharmacy. You will receive an email from them and can purchase the medicine through that link. It will then be mailed to your home at the address you confirmed. If for any reason you do not receive an email from them, please check your spam folder. If you still do not find the email, please let us know. [facility] phone number is [phone].   5-fluorouracil/calcipotriene cream is is a type of field treatment used to treat precancers, thin skin cancers, and areas of sun damage. Reviewed expected reaction including irritation and mild inflammation potentially progressing to more severe inflammation including redness, scaling, crusting and open sores/erosions.  Reviewed if too much irritation occurs, ensure application of only a thin layer and decrease frequency of use to achieve a tolerable level of inflammation. Recommend applying Vaseline ointment to open sores as needed.  Minimize sun exposure while under treatment. Recommend daily broad spectrum sunscreen SPF 30+ to sun-exposed areas, reapply every 2 hours as needed.        If you have any questions or concerns for your doctor, please call our main line at [phone] and press option 4 to reach your doctor's medical assistant. If no one answers, please leave a voicemail as directed and we will return your call as soon as possible. Messages left after 4 pm will be answered the following business day.   You may also send [place] a message via [place]. We typically respond to MyChart messages within 1-2 business days.  For prescription refills, please ask your pharmacy to contact our office. Our fax number is [phone].  If you have an urgent issue when the clinic is closed that cannot wait  until the next business day, you can page your doctor at the number below.    Please note that while we do our best to be available for urgent issues outside of office hours, we are not available 24/7.   If you have an urgent issue and are unable to reach [place], you may choose to seek medical care at your doctor's office, retail clinic, urgent care center, or emergency room.  If you have a medical emergency, please immediately call 911 or go to the emergency department.  Pager Numbers  - Dr. Nehemiah Massed: [phone]  - Dr. Laurence Ferrari: [phone]  - Dr. Nicole Kindred: [phone]  In the event of inclement weather, please call our main line at [phone] for an update on the status of any delays or closures.  Dermatology Medication Tips: Please keep the boxes that topical medications come in in order to help keep track of the instructions about where and how to use these. Pharmacies typically print the medication instructions only on the boxes and not directly on the medication tubes.   If your medication is too expensive, please contact our office at [phone] option 4 or send [place] a message through [place].   We are unable to tell what your co-pay for medications will be in advance as this is different depending on your insurance coverage. However, we may be able to find a substitute medication at lower cost or fill out paperwork to get insurance to cover a needed medication.   If a prior authorization is required to get your medication covered by your insurance company, please allow  [place] 1-2 business days to complete this process.  Drug prices often vary depending on where the prescription is filled and some pharmacies may offer cheaper prices.  The website www.goodrx.com contains coupons for medications through different pharmacies. The prices here do not account for what the cost may be with help from insurance (it may be cheaper with your insurance), but the website can give you the price if you  did not use any insurance.  - You can print the associated coupon and take it with your prescription to the pharmacy.  - You may also stop by our office during regular business hours and pick up a GoodRx coupon card.  - If you need your prescription sent electronically to a different pharmacy, notify our office through [facility] or by phone at [phone] option 4.

## 2020-09-04 ENCOUNTER — Encounter: Payer: Self-pay | Admitting: Internal Medicine

## 2020-09-05 ENCOUNTER — Encounter
Admission: RE | Disposition: A | Discharge status: Home / Self Care | Payer: Self-pay | Source: Home / Self Care | Attending: Internal Medicine

## 2020-09-05 ENCOUNTER — Ambulatory Visit: Payer: BC Managed Care – PPO | Admitting: Certified Registered"

## 2020-09-05 ENCOUNTER — Encounter: Payer: Self-pay | Admitting: Internal Medicine

## 2020-09-05 ENCOUNTER — Ambulatory Visit
Admission: RE | Admit: 2020-09-05 | Discharge: 2020-09-05 | Disposition: A | Discharge status: Home / Self Care | Payer: BC Managed Care – PPO | Attending: Internal Medicine | Admitting: Internal Medicine

## 2020-09-05 DIAGNOSIS — Z8719 Personal history of other diseases of the digestive system: Secondary | ICD-10-CM | POA: Insufficient documentation

## 2020-09-05 DIAGNOSIS — D122 Benign neoplasm of ascending colon: Secondary | ICD-10-CM | POA: Insufficient documentation

## 2020-09-05 DIAGNOSIS — K640 First degree hemorrhoids: Secondary | ICD-10-CM | POA: Diagnosis not present

## 2020-09-05 DIAGNOSIS — D123 Benign neoplasm of transverse colon: Secondary | ICD-10-CM | POA: Insufficient documentation

## 2020-09-05 DIAGNOSIS — Z7982 Long term (current) use of aspirin: Secondary | ICD-10-CM | POA: Diagnosis not present

## 2020-09-05 DIAGNOSIS — D120 Benign neoplasm of cecum: Secondary | ICD-10-CM | POA: Diagnosis not present

## 2020-09-05 DIAGNOSIS — Z79899 Other long term (current) drug therapy: Secondary | ICD-10-CM | POA: Insufficient documentation

## 2020-09-05 DIAGNOSIS — K621 Rectal polyp: Secondary | ICD-10-CM | POA: Diagnosis not present

## 2020-09-05 DIAGNOSIS — Z1211 Encounter for screening for malignant neoplasm of colon: Secondary | ICD-10-CM | POA: Insufficient documentation

## 2020-09-05 HISTORY — DX: Gout, unspecified: M10.9

## 2020-09-05 HISTORY — PX: COLONOSCOPY WITH PROPOFOL: SHX5780

## 2020-09-05 HISTORY — DX: Sleep apnea, unspecified: G47.30

## 2020-09-05 SURGERY — COLONOSCOPY WITH PROPOFOL
Anesthesia: General

## 2020-09-05 MED ORDER — DEXMEDETOMIDINE (PRECEDEX) IN NS 20 MCG/5ML (4 MCG/ML) IV SYRINGE
PREFILLED_SYRINGE | INTRAVENOUS | Status: DC | PRN
  Administered 2020-09-05: 12 ug via INTRAVENOUS

## 2020-09-05 MED ORDER — PROPOFOL 500 MG/50ML IV EMUL
INTRAVENOUS | Status: DC | PRN
  Administered 2020-09-05: 150 ug/kg/min via INTRAVENOUS

## 2020-09-05 MED ORDER — EPHEDRINE SULFATE 50 MG/ML IJ SOLN
INTRAMUSCULAR | Status: DC | PRN
  Administered 2020-09-05: 10 mg via INTRAVENOUS

## 2020-09-05 MED ORDER — PROPOFOL 10 MG/ML IV BOLUS
INTRAVENOUS | Status: DC | PRN
  Administered 2020-09-05: 80 mg via INTRAVENOUS

## 2020-09-05 MED ORDER — SODIUM CHLORIDE 0.9 % IV SOLN
INTRAVENOUS | Status: DC
  Administered 2020-09-05: 20 mL/h via INTRAVENOUS

## 2020-09-05 MED ORDER — LIDOCAINE HCL (CARDIAC) PF 100 MG/5ML IV SOSY
PREFILLED_SYRINGE | INTRAVENOUS | Status: DC | PRN
  Administered 2020-09-05: 50 mg via INTRAVENOUS

## 2020-09-05 NOTE — Anesthesia Preprocedure Evaluation (Signed)
Anesthesia Evaluation  Patient identified by MRN, date of birth, ID band Patient awake    Reviewed: Allergy & Precautions, H&P , NPO status , Patient's Chart, lab work & pertinent test results, reviewed documented beta blocker date and time   History of Anesthesia Complications Negative for: history of anesthetic complications  Airway Mallampati: II  TM Distance: >3 FB Neck ROM: full    Dental  (+) Caps, Dental Advidsory Given, Chipped, Teeth Intact   Pulmonary neg shortness of breath, asthma (as a child) , sleep apnea and Continuous Positive Airway Pressure Ventilation , neg COPD, neg recent URI,    Pulmonary exam normal breath sounds clear to auscultation       Cardiovascular Exercise Tolerance: Good hypertension, (-) angina(-) Past MI and (-) Cardiac Stents Normal cardiovascular exam(-) dysrhythmias (-) Valvular Problems/Murmurs Rhythm:regular Rate:Normal     Neuro/Psych negative neurological ROS  negative psych ROS   GI/Hepatic negative GI ROS, Neg liver ROS,   Endo/Other  negative endocrine ROS  Renal/GU negative Renal ROS  negative genitourinary   Musculoskeletal   Abdominal   Peds  Hematology negative hematology ROS (+)   Anesthesia Other Findings Past Medical History: No date: Actinic keratosis No date: Arthritis No date: Gout No date: Hyperlipidemia No date: Hypertension No date: Measles     Comment:  in childhood No date: Mumps     Comment:  in childhood  No date: Psoriasis No date: Sleep apnea No date: Varicella     Comment:  in childhood   Reproductive/Obstetrics negative OB ROS                             Anesthesia Physical Anesthesia Plan  ASA: 2  Anesthesia Plan: General   Post-op Pain Management:    Induction: Intravenous  PONV Risk Score and Plan: 2 and TIVA and Propofol infusion  Airway Management Planned: Natural Airway and Nasal  Cannula  Additional Equipment:   Intra-op Plan:   Post-operative Plan:   Informed Consent: I have reviewed the patients History and Physical, chart, labs and discussed the procedure including the risks, benefits and alternatives for the proposed anesthesia with the patient or authorized representative who has indicated his/her understanding and acceptance.     Dental Advisory Given  Plan Discussed with: Anesthesiologist, CRNA and Surgeon  Anesthesia Plan Comments:         Anesthesia Quick Evaluation

## 2020-09-05 NOTE — Op Note (Signed)
[facility] Patient Name: Omar Wilson Procedure Date: 09/05/2020 9:31 AM MRN: [id number] Account #: [id number] Date of Birth: 02-15-1961 Admit Type: Outpatient Age: 60 Room: [facility] ENDO ROOM 2 Gender: Male Note Status: Finalized Procedure:             Colonoscopy Indications:           High risk colon cancer surveillance: Personal history                         of colonic polyps Providers:             Benay Pike. Estiven Kohan MD, MD Medicines:             Propofol per Anesthesia Complications:         No immediate complications. Procedure:             Pre-Anesthesia Assessment:                        - The risks and benefits of the procedure and the                         sedation options and risks were discussed with the                         patient. All questions were answered and informed                         consent was obtained.                        - Patient identification and proposed procedure were                         verified prior to the procedure by the nurse. The                         procedure was verified in the procedure room.                        - ASA Grade Assessment: III - A patient with severe                         systemic disease.                        - After reviewing the risks and benefits, the patient                         was deemed in satisfactory condition to undergo the                         procedure.                        After obtaining informed consent, the colonoscope was                         passed under direct vision. Throughout the procedure,  the patient's blood pressure, pulse, and oxygen                         saturations were monitored continuously. The                         Colonoscope was introduced through the anus and                         advanced to the the cecum, identified by appendiceal                         orifice and ileocecal valve. The  colonoscopy was                         performed without difficulty. The patient tolerated                         the procedure well. The quality of the bowel                         preparation was good. The ileocecal valve, appendiceal                         orifice, and rectum were photographed. Findings:      The perianal and digital rectal examinations were normal.      The digital rectal exam findings include enlarged prostate. Pertinent       negatives include normal sphincter tone.      Two sessile polyps were found in the rectum. The polyps were 4 to 5 mm       in size. These polyps were removed with a cold snare. Resection and       retrieval were complete.      Two sessile polyps were found in the cecum. The polyps were 3 to 4 mm in       size. These polyps were removed with a jumbo cold forceps. Resection and       retrieval were complete.      Three sessile polyps were found in the ascending colon. The polyps were       diminutive in size. These polyps were removed with a jumbo cold forceps.       Resection and retrieval were complete.      A 5 mm polyp was found in the transverse colon. The polyp was sessile.       The polyp was removed with a jumbo cold forceps. Resection and retrieval       were complete.      Non-bleeding internal hemorrhoids were found during retroflexion. The       hemorrhoids were Grade I (internal hemorrhoids that do not prolapse).      The exam was otherwise without abnormality. Impression:            - Enlarged prostate found on digital rectal exam.                        - Two 4 to 5 mm polyps in the rectum, removed with a                         cold snare.  Resected and retrieved.                        - Two 3 to 4 mm polyps in the cecum, removed with a                         jumbo cold forceps. Resected and retrieved.                        - Three diminutive polyps in the ascending colon,                         removed with a jumbo cold  forceps. Resected and                         retrieved.                        - One 5 mm polyp in the transverse colon, removed with                         a jumbo cold forceps. Resected and retrieved.                        - Non-bleeding internal hemorrhoids.                        - The examination was otherwise normal. Recommendation:        - Patient has a contact number available for                         emergencies. The signs and symptoms of potential                         delayed complications were discussed with the patient.                         Return to normal activities tomorrow. Written                         discharge instructions were provided to the patient.                        - Resume previous diet.                        - Continue present medications.                        - Repeat colonoscopy is recommended for surveillance.                         The colonoscopy date will be determined after                         pathology results from today's exam become available                         for review.                        -  Return to GI office PRN.                        - The findings and recommendations were discussed with                         the patient. Procedure Code(s):     --- Professional ---                        [phone], Colonoscopy, flexible; with removal of                         tumor(s), polyp(s), or other lesion(s) by snare                         technique                        45380, 28, Colonoscopy, flexible; with biopsy, single                         or multiple Diagnosis Code(s):     --- Professional ---                        N40.0, Benign prostatic hyperplasia without lower                         urinary tract symptoms                        K64.0, First degree hemorrhoids                        K63.5, Polyp of colon                        K62.1, Rectal polyp                        Z86.010, Personal history of  colonic polyps CPT copyright 2019 American Medical Association. All rights reserved. The codes documented in this report are preliminary and upon coder review may  be revised to meet current compliance requirements. Efrain Sella MD, MD 09/05/2020 10:01:17 AM This report has been signed electronically. Number of Addenda: 0 Note Initiated On: 09/05/2020 9:31 AM Scope Withdrawal Time: 0 hours 7 minutes 46 seconds  Total Procedure Duration: 0 hours 13 minutes 7 seconds  Estimated Blood Loss:  Estimated blood loss: none.      [facility]

## 2020-09-05 NOTE — Anesthesia Postprocedure Evaluation (Signed)
Anesthesia Post Note  Patient: Omar Wilson  Procedure(s) Performed: COLONOSCOPY WITH PROPOFOL  Patient location during evaluation: Endoscopy Anesthesia Type: General Level of consciousness: awake and alert Pain management: pain level controlled Vital Signs Assessment: post-procedure vital signs reviewed and stable Respiratory status: spontaneous breathing, nonlabored ventilation, respiratory function stable and patient connected to nasal cannula oxygen Cardiovascular status: blood pressure returned to baseline and stable Postop Assessment: no apparent nausea or vomiting Anesthetic complications: no   No notable events documented.   Last Vitals:  Vitals:   09/05/20 1010 09/05/20 1020  BP: 113/67 122/74  Pulse: 71 64  Resp: (!) 21 18  Temp:    SpO2: 97% 96%    Last Pain:  Vitals:   09/05/20 1020  TempSrc:   PainSc: 0-No pain                 Martha Clan

## 2020-09-05 NOTE — Transfer of Care (Signed)
Immediate Anesthesia Transfer of Care Note  Patient: Omar Wilson  Procedure(s) Performed: COLONOSCOPY WITH PROPOFOL  Patient Location: PACU  Anesthesia Type:General  Level of Consciousness: awake  Airway & Oxygen Therapy: Patient Spontanous Breathing  Post-op Assessment: Report given to RN and Post -op Vital signs reviewed and stable  Post vital signs: stable  Last Vitals:  Vitals Value Taken Time  BP 89/69 09/05/20 1000  Temp 35.9 C 09/05/20 1000  Pulse 73 09/05/20 1001  Resp 17 09/05/20 1001  SpO2 95 % 09/05/20 1001  Vitals shown include unvalidated device data.  Last Pain:  Vitals:   09/05/20 1000  TempSrc: Temporal  PainSc: 0-No pain         Complications: No notable events documented.

## 2020-09-05 NOTE — H&P (Signed)
Outpatient short stay form Pre-procedure 09/05/2020 9:26 AM Symphany Fleissner K. Alice Reichert, M.D.  Primary Physician: [place] Pink, M.D.  Reason for visit:  Personal history of colon polyps [place], [place] - 2016  History of present illness:                            Patient presents for colonoscopy for a personal hx of colon polyps. The patient denies abdominal pain, abnormal weight loss or rectal bleeding.      Current Facility-Administered Medications:    0.9 %  sodium chloride infusion, , Intravenous, Continuous, [place], Benay Pike, MD, Last Rate: 20 mL/hr at 09/05/20 0852, 20 mL/hr at 09/05/20 9702  Medications Prior to Admission  Medication Sig Dispense Refill Last Dose   Adalimumab (HUMIRA) 40 MG/0.4ML PSKT Inject into the skin.   Past Week   allopurinol (ZYLOPRIM) 300 MG tablet Take 1 tablet by mouth daily.   Past Week   aspirin EC 81 MG tablet Take 81 mg by mouth daily.   Past Week   atenolol (TENORMIN) 25 MG tablet Take 25 mg by mouth daily.   Past Week   citalopram (CELEXA) 20 MG tablet Take 20 mg by mouth daily.   Past Week   gabapentin (NEURONTIN) 300 MG capsule Take 300 mg by mouth 3 (three) times daily.   Past Week   ibuprofen (ADVIL) 200 MG tablet Take 200 mg by mouth every 6 (six) hours as needed.   Past Week   omega-3 acid ethyl esters (LOVAZA) 1 g capsule Take by mouth 2 (two) times daily.   Past Week   Omega-3 Fatty Acids (FISH OIL) 1000 MG CAPS Take by mouth.   Past Week   phentermine (ADIPEX-P) 37.5 MG tablet Take 37.5 mg by mouth at bedtime.   Past Week   rosuvastatin (CRESTOR) 10 MG tablet Take 1 tablet by mouth daily.   Past Week   Calcipotriene-Betameth Diprop (ENSTILAR) 0.005-0.064 % FOAM Apply 1 application topically in the morning and at bedtime. (Patient not taking: Reported on 03/21/2020) 60 g 3    traMADol (ULTRAM) 50 MG tablet Take 1 tablet (50 mg total) by mouth every 8 (eight) hours as needed. (Patient not taking: Reported on 03/21/2020) 15 tablet 0      No Known  Allergies   Past Medical History:  Diagnosis Date   Actinic keratosis    Arthritis    Gout    Hyperlipidemia    Hypertension    Measles    in childhood   Mumps    in childhood    Psoriasis    Sleep apnea    Varicella    in childhood    Review of systems:  Otherwise negative.    Physical Exam  Gen: Alert, oriented. Appears stated age.  HEENT: [place]/AT. PERRLA. Lungs: CTA, no wheezes. CV: RR nl S1, S2. Abd: soft, benign, no masses. BS+ Ext: No edema. Pulses 2+    Planned procedures: Proceed with colonoscopy. The patient understands the nature of the planned procedure, indications, risks, alternatives and potential complications including but not limited to bleeding, infection, perforation, damage to internal organs and possible oversedation/side effects from anesthesia. The patient agrees and gives consent to proceed.  Please refer to procedure notes for findings, recommendations and patient disposition/instructions.     Lenzie Montesano K. Alice Reichert, M.D. Gastroenterology 09/05/2020  9:26 AM

## 2020-09-05 NOTE — Interval H&P Note (Signed)
History and Physical Interval Note:  09/05/2020 9:27 AM  Omar Wilson  has presented today for surgery, with the diagnosis of PH POLYPS.  The various methods of treatment have been discussed with the patient and family. After consideration of risks, benefits and other options for treatment, the patient has consented to  Procedure(s): COLONOSCOPY WITH PROPOFOL (N/A) as a surgical intervention.  The patient's history has been reviewed, patient examined, no change in status, stable for surgery.  I have reviewed the patient's chart and labs.  Questions were answered to the patient's satisfaction.     [place], [place]

## 2020-09-06 ENCOUNTER — Encounter: Payer: Self-pay | Admitting: Internal Medicine

## 2020-09-06 LAB — SURGICAL PATHOLOGY

## 2020-09-08 ENCOUNTER — Encounter: Payer: Self-pay | Admitting: Dermatology

## 2020-09-10 ENCOUNTER — Ambulatory Visit: Payer: BC Managed Care – PPO | Admitting: Dermatology

## 2021-03-12 ENCOUNTER — Ambulatory Visit: Payer: BC Managed Care – PPO | Admitting: Dermatology

## 2021-05-03 ENCOUNTER — Other Ambulatory Visit: Payer: Self-pay

## 2021-05-03 ENCOUNTER — Encounter: Payer: Self-pay | Admitting: Nurse Practitioner

## 2021-05-03 ENCOUNTER — Ambulatory Visit: Payer: Self-pay | Admitting: Nurse Practitioner

## 2021-05-03 VITALS — BP 160/90 | HR 50 | Temp 98.3°F | Resp 16

## 2021-05-03 DIAGNOSIS — S39002A Unspecified injury of muscle, fascia and tendon of lower back, initial encounter: Secondary | ICD-10-CM

## 2021-05-03 DIAGNOSIS — M545 Low back pain, unspecified: Secondary | ICD-10-CM

## 2021-05-03 LAB — POCT URINALYSIS DIPSTICK
Bilirubin, UA: NEGATIVE
Blood, UA: NEGATIVE
Glucose, UA: NEGATIVE
Ketones, UA: NEGATIVE
Leukocytes, UA: NEGATIVE
Nitrite, UA: NEGATIVE
Protein, UA: POSITIVE — AB
Spec Grav, UA: 1.02 (ref 1.010–1.025)
Urobilinogen, UA: 0.2 E.U./dL
pH, UA: 5 (ref 5.0–8.0)

## 2021-05-03 MED ORDER — NAPROXEN 500 MG PO TABS: 500.0000 mg | ORAL_TABLET | Freq: Two times a day (BID) | ORAL | 0 refills | Status: AC

## 2021-05-03 MED ORDER — CYCLOBENZAPRINE HCL 10 MG PO TABS: 10.0000 mg | ORAL_TABLET | Freq: Three times a day (TID) | ORAL | 0 refills | Status: DC | PRN

## 2021-05-04 NOTE — Progress Notes (Signed)
? ?Subjective:  ? ? Patient ID: Omar Wilson, male    DOB: [birth year], 61 y.o.   MRN: [id number] ? ?HPI ?61 year old male presenting to [facility] with complaints of left sided back pain for the past 24 hours. He does not remember any injury or trauma/ heavy lifting that instigated injury.  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  ? ?He feels more pain with movement.  ?Denies changed to bowel or urinary patterns  ?Denies systemic symptoms  ?Denies fever or pressure on bladder or bowels.  ? ?Pain increases when laying down.  ?States pain at time of appointment is 8/10 ? ? ?Review of Systems  ?Constitutional: Negative.   ?HENT: Negative.    ?Eyes: Negative.   ?Respiratory: Negative.    ?Cardiovascular: Negative.   ?Gastrointestinal: Negative.   ?Genitourinary: Negative.   ?Musculoskeletal:  Positive for back pain.  ?Neurological: Negative.   ?Hematological: Negative.   ?Psychiatric/Behavioral: Negative.    ? ?   ?Objective:  ? Physical Exam ?HENT:  ?   Head: Normocephalic.  ?Pulmonary:  ?   Effort: Pulmonary effort is normal.  ?Musculoskeletal:  ?   Cervical back: Normal and normal range of motion.  ?   Thoracic back: Normal.  ?   Lumbar back: No swelling, deformity, signs of trauma or bony tenderness. Decreased range of motion. Negative right straight leg raise test and negative left straight leg raise test.  ?     Back: ? ?   Comments: Pain to lower left back, no pain over spine. No pain with palpation or pressure at SI, no CVA tenderness. Able to stand on heels and toes, able to touch toes, able to rotate at waste. Pain is increased when laying down flat and with deep palpation of left lower posterior waste region. No weakness. Sensation intact  ?Skin: ?   General: Skin is warm.  ?Neurological:  ?   General: No focal deficit present.  ?   Mental Status: He is alert.  ?Psychiatric:     ?    Mood and Affect: Mood normal.  ? ? ?Recent Results (from the past 2160 hour(s))  ?POCT Urinalysis Dipstick     Status: Abnormal  ? Collection Time: 05/03/21 12:22 PM  ?Result Value Ref Range  ? Color, UA yellow   ? Clarity, UA clear   ? Glucose, UA Negative Negative  ? Bilirubin, UA negative   ? Ketones, UA Negative   ? Spec Grav, UA 1.020 1.010 - 1.025  ? Blood, UA Negative   ? pH, UA 5.0 5.0 - 8.0  ? Protein, UA Positive (A) Negative  ?  Comment: trace  ? Urobilinogen, UA 0.2 0.2 or 1.0 E.U./dL  ? Nitrite, UA Negative   ? Leukocytes, UA Negative Negative  ? Appearance    ? Odor    ?  ? ? ?   ?Assessment & Plan:  ?1. Injury of muscle of lower back ? ?- cyclobenzaprine (FLEXERIL) 10 MG tablet; Take 1 tablet (10 mg total) by mouth 3 (three) times daily as needed for muscle spasms.  Dispense: 30 tablet; Refill: 0 ?- naproxen (NAPROSYN) 500 MG tablet; Take 1 tablet (500 mg total) by mouth 2 (two) times daily with a meal for 10 days.  Dispense: 20 tablet; Refill: 0 ? ?2. Acute left-sided low back pain without sciatica ? ?-  POCT Urinalysis Dipstick WNL  ? ?Differential kidney stone, diverticulitis, prostatitis discussed symptoms to watch for over the weekend. If pain is not improved with muscle relaxant and NSAID or with new or worsening symptoms patient should be seen in ED for imaging as discussed  ?   ? ?

## 2021-08-19 ENCOUNTER — Encounter: Payer: Self-pay | Admitting: *Deleted

## 2021-09-11 ENCOUNTER — Ambulatory Visit: Payer: BC Managed Care – PPO | Admitting: Urology

## 2021-09-11 ENCOUNTER — Encounter: Payer: Self-pay | Admitting: Urology

## 2021-09-11 VITALS — BP 173/77 | HR 60 | Ht 73.0 in | Wt 264.0 lb

## 2021-09-11 DIAGNOSIS — N4340 Spermatocele of epididymis, unspecified: Secondary | ICD-10-CM | POA: Diagnosis not present

## 2021-09-11 NOTE — Progress Notes (Signed)
   09/11/21 10:58 AM   Omar Wilson 01/05/1961 [id number]  CC: Scrotal swelling/spermatocele  HPI: 61 year old male who reports 2 to 3 years of right-sided scrotal swelling.  This was evaluated with a scrotal ultrasound in June 2022 and was consistent with a ~4cm spermatocele/epididymal cyst with simple fluid.  He denies any pain or bother from the swelling.  Denies any urinary symptoms.   PMH: Past Medical History:  Diagnosis Date   Actinic keratosis    Arthritis    Gout    Hyperlipidemia    Hypertension    Measles    in childhood   Mumps    in childhood    Psoriasis    Sleep apnea    Varicella    in childhood    Surgical History: Past Surgical History:  Procedure Laterality Date   colon polyps     COLONOSCOPY WITH PROPOFOL N/A 09/05/2020   Procedure: COLONOSCOPY WITH PROPOFOL;  Surgeon: Toledo, Benay Pike, MD;  Location: [facility] ENDOSCOPY;  Service: Gastroenterology;  Laterality: N/A;   KNEE SURGERY  1978-1997   x6    Family History: Family History  Problem Relation Age of Onset   Kidney disease Mother    Stroke Father     Social History:  reports that he has never smoked. His smokeless tobacco use includes chew. He reports current alcohol use. He reports that he does not use drugs.  Physical Exam: BP (!) 173/77 (BP Location: Left Arm, Patient Position: Sitting, Cuff Size: Large)   Pulse 60   Ht '6\' 1"'$  (1.854 m)   Wt 264 lb (119.7 kg)   BMI 34.83 kg/m    Constitutional:  Alert and oriented, No acute distress. Cardiovascular: No clubbing, cyanosis, or edema. Respiratory: Normal respiratory effort, no increased work of breathing. GI: Abdomen is soft, nontender, nondistended, no abdominal masses GU: Moderate-sized 4 cm right spermatocele, nontender, no testicular masses   Pertinent Imaging: I have personally viewed and interpreted the scrotal ultrasound showing a 4 cm right-sided epididymal cyst/spermatocele.  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  We discussed options including observation or surgical excision.  Risks including bleeding, infection, 4 to 6-week healing time, and risk of recurrence were discussed.  He is minimally bothered by the spermatocele at this time and would like to pursue observation which is very reasonable.  Return precautions were discussed, and he can follow-up with urology as needed   Nickolas Madrid, MD 09/11/2021  [facility] [address] [place], [place] [address] [phone]

## 2021-09-11 NOTE — Patient Instructions (Signed)
Spermatocele  A spermatocele is a fluid-filled sac (cyst) inside the sac that holds the testicles (scrotum). This type of cyst often forms in the epididymis. The epididymis is a coiled tube at the top of each testicle, and this tube is where sperm are stored. The cyst sometimes forms along a tube called the vas deferens, which is a tube that carries sperm away from the epididymis. Spermatoceles are usually painless. Most cysts are small, but they can grow larger. Spermatoceles are not cancerous (are benign). What are the causes? The cause of this condition is not known. However, this condition usually results from a blockage in one of the many small tubes (tubules) that carry sperm from your testicle to your vas deferens. What are the signs or symptoms? In most cases, small cysts do not cause symptoms. However, symptoms sometimes occur. Symptoms of this condition include: Dull pain. A feeling of heaviness. An enlarged scrotum, if your cyst is large. How is this diagnosed? This condition is diagnosed based on a physical exam. You or your health care provider may notice your cyst when feeling your scrotum. Your health care provider may shine a light through (transilluminate) your scrotum to see if light will pass through your cyst. You may have an ultrasound of the scrotum to rule out a tumor. How is this treated? Small spermatoceles do not need to be treated. If your spermatocele has grown large or is uncomfortable, your health care provider may recommend surgery to remove it. Follow these instructions at home: Check your spermatocele regularly for any changes. Do regular self-exams of your scrotum. Keep all follow-up visits. This is important. Contact a health care provider if: Your spermatocele gets larger. You have pain in your scrotum. Your spermatocele comes back after treatment. Get help right away if: You experience severe pain and redness of your scrotum. Summary A spermatocele  is a fluid-filled sac, or a cyst, inside the sac that holds the testicles (scrotum). This condition is usually painless, and it is not cancerous (is benign). Your health care provider may recommend surgery to remove your spermatocele if it grows large or is uncomfortable. If you have a spermatocele, check for any changes and do self-exams of your scrotum. Keep all follow-up visits. This is important. This information is not intended to replace advice given to you by your health care provider. Make sure you discuss any questions you have with your health care provider. Document Revised: 10/08/2020 Document Reviewed: 10/08/2020 Elsevier Patient Education  2023 Elsevier Inc.   

## 2022-02-13 ENCOUNTER — Other Ambulatory Visit: Payer: Self-pay | Admitting: Family Medicine

## 2022-02-13 DIAGNOSIS — E785 Hyperlipidemia, unspecified: Secondary | ICD-10-CM

## 2022-02-25 ENCOUNTER — Ambulatory Visit: Payer: BC Managed Care – PPO

## 2022-03-13 ENCOUNTER — Other Ambulatory Visit: Payer: Self-pay | Admitting: Physician Assistant

## 2022-03-13 DIAGNOSIS — R9439 Abnormal result of other cardiovascular function study: Secondary | ICD-10-CM

## 2022-03-13 DIAGNOSIS — R5381 Other malaise: Secondary | ICD-10-CM

## 2022-03-13 DIAGNOSIS — I472 Ventricular tachycardia, unspecified: Secondary | ICD-10-CM

## 2022-03-20 ENCOUNTER — Encounter (HOSPITAL_COMMUNITY): Payer: Self-pay

## 2022-03-21 ENCOUNTER — Telehealth (HOSPITAL_COMMUNITY): Payer: Self-pay | Admitting: *Deleted

## 2022-03-21 NOTE — Telephone Encounter (Signed)
Attempted to call patient regarding upcoming cardiac CT appointment. °Left message on voicemail with name and callback number ° °Aubrey Blackard RN Navigator Cardiac Imaging °[facility] °[phone] Office °[phone] Cell ° °

## 2022-03-24 ENCOUNTER — Ambulatory Visit
Admission: RE | Admit: 2022-03-24 | Discharge: 2022-03-24 | Disposition: A | Discharge status: Home / Self Care | Payer: BC Managed Care – PPO | Source: Ambulatory Visit | Attending: Physician Assistant | Admitting: Physician Assistant

## 2022-03-24 DIAGNOSIS — R5381 Other malaise: Secondary | ICD-10-CM | POA: Diagnosis not present

## 2022-03-24 DIAGNOSIS — R9439 Abnormal result of other cardiovascular function study: Secondary | ICD-10-CM | POA: Diagnosis present

## 2022-03-24 DIAGNOSIS — I472 Ventricular tachycardia, unspecified: Secondary | ICD-10-CM | POA: Insufficient documentation

## 2022-03-24 DIAGNOSIS — R5383 Other fatigue: Secondary | ICD-10-CM | POA: Insufficient documentation

## 2022-03-24 DIAGNOSIS — R9430 Abnormal result of cardiovascular function study, unspecified: Secondary | ICD-10-CM

## 2022-03-24 MED ORDER — IOHEXOL 350 MG/ML SOLN
100.0000 mL | Freq: Once | INTRAVENOUS | Status: AC | PRN
  Administered 2022-03-24: 100 mL via INTRAVENOUS

## 2022-03-24 MED ORDER — NITROGLYCERIN 0.4 MG SL SUBL
0.8000 mg | SUBLINGUAL_TABLET | Freq: Once | SUBLINGUAL | Status: AC
  Administered 2022-03-24: 0.8 mg via SUBLINGUAL
  Filled 2022-03-24: qty 25

## 2022-03-24 NOTE — Progress Notes (Signed)
Patient tolerated procedure well. Ambulate w/o difficulty. Denies any lightheadedness or being dizzy. Pt denies any pain at this time. Sitting in chair, pt is encouraged to drink additional water throughout the day and reason explained to patient. Patient verbalized understanding and all questions answered. ABC intact. No further needs at this time. Discharge from procedure area w/o issues.  

## 2023-01-09 ENCOUNTER — Encounter: Payer: Self-pay | Admitting: Oncology

## 2023-01-09 ENCOUNTER — Inpatient Hospital Stay: Payer: BC Managed Care – PPO

## 2023-01-09 ENCOUNTER — Inpatient Hospital Stay: Payer: BC Managed Care – PPO | Attending: Oncology | Admitting: Oncology

## 2023-01-09 VITALS — BP 123/74 | HR 73 | Temp 97.9°F | Resp 16 | Ht 73.0 in | Wt 249.0 lb

## 2023-01-09 DIAGNOSIS — E785 Hyperlipidemia, unspecified: Secondary | ICD-10-CM | POA: Insufficient documentation

## 2023-01-09 DIAGNOSIS — D751 Secondary polycythemia: Secondary | ICD-10-CM

## 2023-01-09 DIAGNOSIS — Z833 Family history of diabetes mellitus: Secondary | ICD-10-CM | POA: Diagnosis not present

## 2023-01-09 DIAGNOSIS — I10 Essential (primary) hypertension: Secondary | ICD-10-CM | POA: Insufficient documentation

## 2023-01-09 DIAGNOSIS — G4730 Sleep apnea, unspecified: Secondary | ICD-10-CM | POA: Insufficient documentation

## 2023-01-09 DIAGNOSIS — Z860100 Personal history of colon polyps, unspecified: Secondary | ICD-10-CM | POA: Insufficient documentation

## 2023-01-09 DIAGNOSIS — Z823 Family history of stroke: Secondary | ICD-10-CM | POA: Insufficient documentation

## 2023-01-09 DIAGNOSIS — M109 Gout, unspecified: Secondary | ICD-10-CM | POA: Diagnosis not present

## 2023-01-09 DIAGNOSIS — Z79899 Other long term (current) drug therapy: Secondary | ICD-10-CM | POA: Diagnosis not present

## 2023-01-09 DIAGNOSIS — Z7982 Long term (current) use of aspirin: Secondary | ICD-10-CM | POA: Insufficient documentation

## 2023-01-09 LAB — CBC (CANCER CENTER ONLY)
HCT: 53.1 % — ABNORMAL HIGH (ref 39.0–52.0)
Hemoglobin: 18 g/dL — ABNORMAL HIGH (ref 13.0–17.0)
MCH: 31.8 pg (ref 26.0–34.0)
MCHC: 33.9 g/dL (ref 30.0–36.0)
MCV: 93.8 fL (ref 80.0–100.0)
Platelet Count: 325 10*3/uL (ref 150–400)
RBC: 5.66 MIL/uL (ref 4.22–5.81)
RDW: 14.1 % (ref 11.5–15.5)
WBC Count: 7.4 10*3/uL (ref 4.0–10.5)
nRBC: 0 % (ref 0.0–0.2)

## 2023-01-09 LAB — COMPREHENSIVE METABOLIC PANEL
ALT: 18 U/L (ref 0–44)
AST: 24 U/L (ref 15–41)
Albumin: 4.2 g/dL (ref 3.5–5.0)
Alkaline Phosphatase: 70 U/L (ref 38–126)
Anion gap: 6 (ref 5–15)
BUN: 13 mg/dL (ref 8–23)
CO2: 25 mmol/L (ref 22–32)
Calcium: 8.8 mg/dL — ABNORMAL LOW (ref 8.9–10.3)
Chloride: 104 mmol/L (ref 98–111)
Creatinine, Ser: 1.25 mg/dL — ABNORMAL HIGH (ref 0.61–1.24)
GFR, Estimated: >60 mL/min (ref >60)
Glucose, Bld: 80 mg/dL (ref 70–99)
Potassium: 3.8 mmol/L (ref 3.5–5.1)
Sodium: 135 mmol/L (ref 135–145)
Total Bilirubin: 1 mg/dL (ref <1.2)
Total Protein: 7.2 g/dL (ref 6.5–8.1)

## 2023-01-09 LAB — IRON AND TIBC
Iron: 162 ug/dL (ref 45–182)
Saturation Ratios: 42 % — ABNORMAL HIGH (ref 17.9–39.5)
TIBC: 389 ug/dL (ref 250–450)
UIBC: 227 ug/dL

## 2023-01-09 LAB — FERRITIN: Ferritin: 55 ng/mL (ref 24–336)

## 2023-01-09 NOTE — Progress Notes (Signed)
[facility]  Telephone:(336) [phone] Fax:([phone]) [phone]  ID: Omar Wilson OB: Oct 28, 1960  MR#: [id number]  SAY#:[id number]  Patient Care Team: Jerl Mina, MD as PCP - General (Family Medicine)  CHIEF COMPLAINT: Secondary polycythemia.  INTERVAL HISTORY: Patient is a 62 year old male who was recently started on testosterone injections approximately 6 months ago that was noted to have an increasing hemoglobin on routine blood work.  He currently feels well and is asymptomatic.  He has no neurologic complaints.  He denies any recent fevers or illnesses.  He has a good appetite and denies weight loss.  He has no chest pain, shortness of breath, cough, or hemoptysis.  He denies any nausea, vomiting, constipation, or diarrhea.  He has no urinary complaints.  Patient offers no specific complaints today.  REVIEW OF SYSTEMS:   Review of Systems  Constitutional: Negative.  Negative for fever, malaise/fatigue and weight loss.  Respiratory: Negative.  Negative for cough, hemoptysis and shortness of breath.   Cardiovascular: Negative.  Negative for chest pain and leg swelling.  Gastrointestinal: Negative.  Negative for abdominal pain.  Genitourinary: Negative.  Negative for dysuria.  Musculoskeletal: Negative.  Negative for back pain.  Skin: Negative.  Negative for rash.  Neurological: Negative.  Negative for dizziness, focal weakness, weakness and headaches.  Psychiatric/Behavioral: Negative.  The patient is not nervous/anxious.     As per HPI. Otherwise, a complete review of systems is negative.  PAST MEDICAL HISTORY: Past Medical History:  Diagnosis Date   Actinic keratosis    Arthritis    Gout    Hyperlipidemia    Hypertension    Measles    in childhood   Mumps    in childhood    Psoriasis    Sleep apnea    Varicella    in childhood    PAST SURGICAL HISTORY: Past Surgical History:  Procedure Laterality Date   colon polyps     COLONOSCOPY WITH PROPOFOL  N/A 09/05/2020   Procedure: COLONOSCOPY WITH PROPOFOL;  Surgeon: Toledo, Boykin Nearing, MD;  Location: [facility] ENDOSCOPY;  Service: Gastroenterology;  Laterality: N/A;   CPAP     KNEE SURGERY  [phone]   x6     FAMILY HISTORY: Family History  Problem Relation Age of Onset   Diabetes Mother    Kidney disease Mother    Cancer Father        prostat and skin cancer   Stroke Father     ADVANCED DIRECTIVES (Y/N):  N  HEALTH MAINTENANCE: Social History   Tobacco Use   Smoking status: Never   Smokeless tobacco: Current    Types: Chew  Vaping Use   Vaping status: Never Used  Substance Use Topics   Alcohol use: Yes    Comment: drinks 6-8 beers 3-4x week   Drug use: Never     Colonoscopy:  PAP:  Bone density:  Lipid panel:  No Known Allergies  Current Outpatient Medications  Medication Sig Dispense Refill   Adalimumab-adaz 40 MG/0.4ML SOSY Inject 0.4 mg into the skin.     allopurinol (ZYLOPRIM) 300 MG tablet Take 1 tablet by mouth daily.     aspirin EC 81 MG tablet Take 81 mg by mouth daily.     citalopram (CELEXA) 20 MG tablet Take 20 mg by mouth daily.     ibuprofen (ADVIL) 200 MG tablet Take 200 mg by mouth every 6 (six) hours as needed.     metoprolol succinate (TOPROL-XL) 25 MG 24 hr tablet Take 12.5  mg by mouth daily.     omega-3 acid ethyl esters (LOVAZA) 1 g capsule Take by mouth 2 (two) times daily.     rosuvastatin (CRESTOR) 10 MG tablet Take 1 tablet by mouth daily.     testosterone cypionate (DEPOTESTOSTERONE CYPIONATE) 200 MG/ML injection Inject 200 mg into the muscle every 14 (fourteen) days.     traZODone (DESYREL) 50 MG tablet Take 50 mg by mouth at bedtime.     No current facility-administered medications for this visit.    OBJECTIVE: Vitals:   01/09/23 1113  BP: 123/74  Pulse: 73  Resp: 16  Temp: 97.9 F (36.6 C)  SpO2: 99%     Body mass index is 32.85 kg/m.    ECOG FS:0 - Asymptomatic  General: Well-developed, well-nourished, no acute  distress. Eyes: Pink conjunctiva, anicteric sclera. HEENT: Normocephalic, moist mucous membranes. Lungs: No audible wheezing or coughing. Heart: Regular rate and rhythm. Abdomen: Soft, nontender, no obvious distention. Musculoskeletal: No edema, cyanosis, or clubbing. Neuro: Alert, answering all questions appropriately. Cranial nerves grossly intact. Skin: No rashes or petechiae noted. Psych: Normal affect. Lymphatics: No cervical, calvicular, axillary or inguinal LAD.   LAB RESULTS:  Lab Results  Component Value Date   NA 142 08/17/2017   K 4.6 08/17/2017   CL 101 08/17/2017   CO2 23 08/17/2017   GLUCOSE 88 08/17/2017   BUN 12 08/17/2017   CREATININE 1.13 08/17/2017   CALCIUM 9.5 08/17/2017   PROT 7.2 08/17/2017   ALBUMIN 4.3 08/17/2017   AST 18 08/17/2017   ALT 19 08/17/2017   ALKPHOS 97 08/17/2017   BILITOT 0.8 08/17/2017   GFRNONAA 71 08/17/2017   GFRAA 82 08/17/2017    Lab Results  Component Value Date   WBC 7.4 01/09/2023   NEUTROABS 6.5 08/17/2017   HGB 18.0 (H) 01/09/2023   HCT 53.1 (H) 01/09/2023   MCV 93.8 01/09/2023   PLT 325 01/09/2023     STUDIES: No results found.  ASSESSMENT: Secondary polycythemia.  PLAN:    Secondary polycythemia: Likely due to testosterone injections.  Patient's hemoglobin remains elevated at 18.0.  The remainder of his blood work including iron stores, erythropoietin level, and JAK2 mutation with reflex were drawn for completeness today and are pending at time of dictation.  Patient has been instructed to go to [facility] every 60 days to donate blood.  If he cannot do this, phlebotomy can be completed at the cancer center.  Return to clinic in 6 months with repeat laboratory work and further evaluation.    I spent a total of 45 minutes reviewing chart data, face-to-face evaluation with the patient, counseling and coordination of care as detailed above.  Patient expressed understanding and was in agreement with this plan. He  also understands that He can call clinic at any time with any questions, concerns, or complaints.    Jeralyn Ruths, MD   01/09/2023 12:14 PM

## 2023-01-11 LAB — ERYTHROPOIETIN: Erythropoietin: 14.4 m[IU]/mL (ref 2.6–18.5)

## 2023-01-12 LAB — CARBON MONOXIDE, BLOOD (PERFORMED AT REF LAB): Carbon Monoxide, Blood: 2 % (ref 0.0–3.6)

## 2023-01-21 LAB — JAK2 V617F RFX CALR/MPL/E12-15

## 2023-01-21 LAB — CALR +MPL + E12-E15  (REFLEX)

## 2023-03-24 ENCOUNTER — Ambulatory Visit: Payer: 59 | Admitting: Dermatology

## 2023-03-24 DIAGNOSIS — C4492 Squamous cell carcinoma of skin, unspecified: Secondary | ICD-10-CM

## 2023-03-24 DIAGNOSIS — L578 Other skin changes due to chronic exposure to nonionizing radiation: Secondary | ICD-10-CM

## 2023-03-24 DIAGNOSIS — L820 Inflamed seborrheic keratosis: Secondary | ICD-10-CM

## 2023-03-24 DIAGNOSIS — D492 Neoplasm of unspecified behavior of bone, soft tissue, and skin: Secondary | ICD-10-CM | POA: Diagnosis not present

## 2023-03-24 DIAGNOSIS — L570 Actinic keratosis: Secondary | ICD-10-CM | POA: Diagnosis not present

## 2023-03-24 DIAGNOSIS — D489 Neoplasm of uncertain behavior, unspecified: Secondary | ICD-10-CM

## 2023-03-24 DIAGNOSIS — W908XXA Exposure to other nonionizing radiation, initial encounter: Secondary | ICD-10-CM | POA: Diagnosis not present

## 2023-03-24 DIAGNOSIS — L821 Other seborrheic keratosis: Secondary | ICD-10-CM

## 2023-03-24 DIAGNOSIS — C44329 Squamous cell carcinoma of skin of other parts of face: Secondary | ICD-10-CM | POA: Diagnosis not present

## 2023-03-24 DIAGNOSIS — C44320 Squamous cell carcinoma of skin of unspecified parts of face: Secondary | ICD-10-CM

## 2023-03-24 HISTORY — DX: Squamous cell carcinoma of skin, unspecified: C44.92

## 2023-03-24 NOTE — Progress Notes (Signed)
Follow-Up Visit   Subjective  Omar Wilson is a 63 y.o. male who presents for the following: spot 4 - 5 weeks ago that has scabbed at left cheek,  The patient has spots, moles and lesions to be evaluated, some may be new or changing and the patient may have concern these could be cancer.  The following portions of the chart were reviewed this encounter and updated as appropriate: medications, allergies, medical history  Review of Systems:  No other skin or systemic complaints except as noted in HPI or Assessment and Plan.  Objective  Well appearing patient in no apparent distress; mood and affect are within normal limits.   A focused examination was performed of the following areas: Face, left cheek, b/l ears , arms and hands  Relevant exam findings are noted in the Assessment and Plan.  left cheek zygoma 1.1 cm hyperkeratotic papule   face and ears x 17 (17) Erythematous thin papules/macules with gritty scale.  b/l hands and arms x 30 (30) Erythematous stuck-on, waxy papule or plaque  Assessment & Plan   NEOPLASM OF UNCERTAIN BEHAVIOR left cheek zygoma Epidermal / dermal shaving  Lesion diameter (cm):  1.1 Informed consent: discussed and consent obtained   Timeout: patient name, date of birth, surgical site, and procedure verified   Procedure prep:  Patient was prepped and draped in usual sterile fashion Prep type:  Isopropyl alcohol Anesthesia: the lesion was anesthetized in a standard fashion   Anesthetic:  1% lidocaine w/ epinephrine 1-100,000 buffered w/ 8.4% NaHCO3 Instrument used: flexible razor blade   Hemostasis achieved with: pressure, aluminum chloride and electrodesiccation   Outcome: patient tolerated procedure well   Post-procedure details: sterile dressing applied and wound care instructions given   Dressing type: bandage and petrolatum    Destruction of lesion Complexity: extensive   Destruction method: electrodesiccation and curettage   Informed  consent: discussed and consent obtained   Timeout:  patient name, date of birth, surgical site, and procedure verified Procedure prep:  Patient was prepped and draped in usual sterile fashion Prep type:  Isopropyl alcohol Anesthesia: the lesion was anesthetized in a standard fashion   Anesthetic:  1% lidocaine w/ epinephrine 1-100,000 buffered w/ 8.4% NaHCO3 Curettage performed in three different directions: Yes   Electrodesiccation performed over the curetted area: Yes   Lesion length (cm):  1.1 Lesion width (cm):  1.1 Margin per side (cm):  0.2 Final wound size (cm):  1.5 Hemostasis achieved with:  pressure, aluminum chloride and electrodesiccation Outcome: patient tolerated procedure well with no complications   Post-procedure details: sterile dressing applied and wound care instructions given   Dressing type: bandage and petrolatum   Specimen 1 - Surgical pathology Differential Diagnosis: r/o scc  Check Margins: No R/o scc  ACTINIC KERATOSIS (17) face and ears x 17 (17) Actinic keratoses are precancerous spots that appear secondary to cumulative UV radiation exposure/sun exposure over time. They are chronic with expected duration over 1 year. A portion of actinic keratoses will progress to squamous cell carcinoma of the skin. It is not possible to reliably predict which spots will progress to skin cancer and so treatment is recommended to prevent development of skin cancer.  Recommend daily broad spectrum sunscreen SPF 30+ to sun-exposed areas, reapply every 2 hours as needed.  Recommend staying in the shade or wearing long sleeves, sun glasses (UVA+UVB protection) and wide brim hats (4-inch brim around the entire circumference of the hat). Call for new or changing lesions.  Destruction of lesion - face and ears x 17 (17) Complexity: simple   Destruction method: cryotherapy   Informed consent: discussed and consent obtained   Timeout:  patient name, date of birth, surgical site,  and procedure verified Lesion destroyed using liquid nitrogen: Yes   Region frozen until ice ball extended beyond lesion: Yes   Outcome: patient tolerated procedure well with no complications   Post-procedure details: wound care instructions given   INFLAMED SEBORRHEIC KERATOSIS (30) b/l hands and arms x 30 (30) Symptomatic, irritating, patient would like treated. Destruction of lesion - b/l hands and arms x 30 (30) Complexity: simple   Destruction method: cryotherapy   Informed consent: discussed and consent obtained   Timeout:  patient name, date of birth, surgical site, and procedure verified Lesion destroyed using liquid nitrogen: Yes   Region frozen until ice ball extended beyond lesion: Yes   Outcome: patient tolerated procedure well with no complications   Post-procedure details: wound care instructions given    ACTINIC DAMAGE - chronic, secondary to cumulative UV radiation exposure/sun exposure over time - diffuse scaly erythematous macules with underlying dyspigmentation - Recommend daily broad spectrum sunscreen SPF 30+ to sun-exposed areas, reapply every 2 hours as needed.  - Recommend staying in the shade or wearing long sleeves, sun glasses (UVA+UVB protection) and wide brim hats (4-inch brim around the entire circumference of the hat). - Call for new or changing lesions.  SEBORRHEIC KERATOSIS - Stuck-on, waxy, tan-brown papules and/or plaques  - Benign-appearing - Discussed benign etiology and prognosis. - Observe - Call for any changes  Return in about 6 months (around 09/21/2023) for ak followup.  IAsher Muir, CMA, am acting as scribe for Armida Sans, MD.   Documentation: I have reviewed the above documentation for accuracy and completeness, and I agree with the above.  Armida Sans, MD

## 2023-03-24 NOTE — Patient Instructions (Addendum)
Biopsy Wound Care Instructions  Leave the original bandage on for 24 hours if possible.  If the bandage becomes soaked or soiled before that time, it is OK to remove it and examine the wound.  A small amount of post-operative bleeding is normal.  If excessive bleeding occurs, remove the bandage, place gauze over the site and apply continuous pressure (no peeking) over the area for 30 minutes. If this does not work, please call our clinic as soon as possible or page your doctor if it is after hours.   Once a day, cleanse the wound with soap and water. It is fine to shower. If a thick crust develops you may use a Q-tip dipped into dilute hydrogen peroxide (mix 1:1 with water) to dissolve it.  Hydrogen peroxide can slow the healing process, so use it only as needed.    After washing, apply petroleum jelly (Vaseline) or an antibiotic ointment if your doctor prescribed one for you, followed by a bandage.    For best healing, the wound should be covered with a layer of ointment at all times. If you are not able to keep the area covered with a bandage to hold the ointment in place, this may mean re-applying the ointment several times a day.  Continue this wound care until the wound has healed and is no longer open.   Itching and mild discomfort is normal during the healing process. However, if you develop pain or severe itching, please call our office.   If you have any discomfort, you can take Tylenol (acetaminophen) or ibuprofen as directed on the bottle. (Please do not take these if you have an allergy to them or cannot take them for another reason).  Some redness, tenderness and white or yellow material in the wound is normal healing.  If the area becomes very sore and red, or develops a thick yellow-green material (pus), it may be infected; please notify us.    If you have stitches, return to clinic as directed to have the stitches removed. You will continue wound care for 2-3 days after the stitches  are removed.   Wound healing continues for up to one year following surgery. It is not unusual to experience pain in the scar from time to time during the interval.  If the pain becomes severe or the scar thickens, you should notify the office.    A slight amount of redness in a scar is expected for the first six months.  After six months, the redness will fade and the scar will soften and fade.  The color difference becomes less noticeable with time.  If there are any problems, return for a post-op surgery check at your earliest convenience.  To improve the appearance of the scar, you can use silicone scar gel, cream, or sheets (such as Mederma or Serica) every night for up to one year. These are available over the counter (without a prescription).  Please call our office at [phone] for any questions or concerns.      Seborrheic Keratosis  What causes seborrheic keratoses? Seborrheic keratoses are harmless, common skin growths that first appear during adult life.  As time goes by, more growths appear.  Some people may develop a large number of them.  Seborrheic keratoses appear on both covered and uncovered body parts.  They are not caused by sunlight.  The tendency to develop seborrheic keratoses can be inherited.  They vary in color from skin-colored to gray, brown, or even black.  They can be either smooth or have a rough, warty surface.   Seborrheic keratoses are superficial and look as if they were stuck on the skin.  Under the microscope this type of keratosis looks like layers upon layers of skin.  That is why at times the top layer may seem to fall off, but the rest of the growth remains and re-grows.    Treatment Seborrheic keratoses do not need to be treated, but can easily be removed in the office.  Seborrheic keratoses often cause symptoms when they rub on clothing or jewelry.  Lesions can be in the way of shaving.  If they become inflamed, they can cause itching, soreness, or  burning.  Removal of a seborrheic keratosis can be accomplished by freezing, burning, or surgery. If any spot bleeds, scabs, or grows rapidly, please return to have it checked, as these can be an indication of a skin cancer.   Cryotherapy Aftercare  Wash gently with soap and water everyday.   Apply Vaseline and Band-Aid daily until healed.    Actinic keratoses are precancerous spots that appear secondary to cumulative UV radiation exposure/sun exposure over time. They are chronic with expected duration over 1 year. A portion of actinic keratoses will progress to squamous cell carcinoma of the skin. It is not possible to reliably predict which spots will progress to skin cancer and so treatment is recommended to prevent development of skin cancer.  Recommend daily broad spectrum sunscreen SPF 30+ to sun-exposed areas, reapply every 2 hours as needed.  Recommend staying in the shade or wearing long sleeves, sun glasses (UVA+UVB protection) and wide brim hats (4-inch brim around the entire circumference of the hat). Call for new or changing lesions.      Due to recent changes in healthcare laws, you may see results of your pathology and/or laboratory studies on MyChart before the doctors have had a chance to review them. We understand that in some cases there may be results that are confusing or concerning to you. Please understand that not all results are received at the same time and often the doctors may need to interpret multiple results in order to provide you with the best plan of care or course of treatment. Therefore, we ask that you please give [place] 2 business days to thoroughly review all your results before contacting the office for clarification. Should we see a critical lab result, you will be contacted sooner.   If You Need Anything After Your Visit  If you have any questions or concerns for your doctor, please call our main line at [phone] and press option 4 to reach your  doctor's medical assistant. If no one answers, please leave a voicemail as directed and we will return your call as soon as possible. Messages left after 4 pm will be answered the following business day.   You may also send [place] a message via MyChart. We typically respond to MyChart messages within 1-2 business days.  For prescription refills, please ask your pharmacy to contact our office. Our fax number is [phone].  If you have an urgent issue when the clinic is closed that cannot wait until the next business day, you can page your doctor at the number below.    Please note that while we do our best to be available for urgent issues outside of office hours, we are not available 24/7.   If you have an urgent issue and are unable to reach [place], you may choose to  seek medical care at your doctor's office, retail clinic, urgent care center, or emergency room.  If you have a medical emergency, please immediately call 911 or go to the emergency department.  Pager Numbers  - Dr. Gwen Pounds: [phone]  - Dr. Roseanne Reno: [phone]  - Dr. Katrinka Blazing: [phone]   In the event of inclement weather, please call our main line at [phone] for an update on the status of any delays or closures.  Dermatology Medication Tips: Please keep the boxes that topical medications come in in order to help keep track of the instructions about where and how to use these. Pharmacies typically print the medication instructions only on the boxes and not directly on the medication tubes.   If your medication is too expensive, please contact our office at [phone] option 4 or send [place] a message through MyChart.   We are unable to tell what your co-pay for medications will be in advance as this is different depending on your insurance coverage. However, we may be able to find a substitute medication at lower cost or fill out paperwork to get insurance to cover a needed medication.   If a prior authorization is  required to get your medication covered by your insurance company, please allow [place] 1-2 business days to complete this process.  Drug prices often vary depending on where the prescription is filled and some pharmacies may offer cheaper prices.  The website www.goodrx.com contains coupons for medications through different pharmacies. The prices here do not account for what the cost may be with help from insurance (it may be cheaper with your insurance), but the website can give you the price if you did not use any insurance.  - You can print the associated coupon and take it with your prescription to the pharmacy.  - You may also stop by our office during regular business hours and pick up a GoodRx coupon card.  - If you need your prescription sent electronically to a different pharmacy, notify our office through [facility] or by phone at [phone] option 4.     Si Usted Necesita Algo Despus de Su Visita  Tambin puede enviarnos un mensaje a travs de Clinical cytogeneticist. Por lo general respondemos a los mensajes de MyChart en el transcurso de 1 a 2 das hbiles.  Para renovar recetas, por favor pida a su farmacia que se ponga en contacto con nuestra oficina. Annie Sable de fax es [place] [phone].  Si tiene un asunto urgente cuando la clnica est cerrada y que no puede esperar hasta el siguiente da hbil, puede llamar/localizar a su doctor(a) al nmero que aparece a continuacin.   Por favor, tenga en cuenta que aunque hacemos todo lo posible para estar disponibles para asuntos urgentes fuera del horario de [place], no estamos disponibles las 24 horas del da, los 7 [address]  [address] de la [place].   Si tiene un problema urgente y no puede comunicarse con nosotros, puede optar por buscar atencin mdica  en el consultorio de su doctor(a), en una clnica privada, en un centro de atencin urgente o en una sala de emergencias.  Si tiene Engineer, drilling, por favor llame inmediatamente al 911 o vaya a  la sala de emergencias.  Nmeros de bper  - Dr. Gwen Pounds: [phone]  - Dra. Roseanne Reno: [phone]  - Dr. Katrinka Blazing: [phone]   En caso de inclemencias del tiempo, por favor llame a Lacy Duverney principal al [phone] para una actualizacin sobre el [place] de cualquier retraso o cierre.  Consejos para la medicacin en dermatologa: Por favor, guarde las cajas en las que vienen los medicamentos de uso tpico para ayudarle a seguir las instrucciones sobre dnde y cmo usarlos. Las farmacias generalmente imprimen las instrucciones del medicamento slo en las cajas y no directamente en los tubos del [place].   Si su medicamento es muy caro, por favor, pngase en contacto con Rolm Gala llamando al [phone] y presione la opcin 4 o envenos un mensaje a travs de Clinical cytogeneticist.   No podemos decirle cul ser su copago por los medicamentos por adelantado ya que esto es diferente dependiendo de la cobertura de su seguro. Sin embargo, es posible que podamos encontrar un medicamento sustituto a Audiological scientist un formulario para que el seguro cubra el medicamento que se considera necesario.   Si se requiere una autorizacin previa para que su compaa de seguros [place] su medicamento, por favor permtanos de 1 a 2 das hbiles para completar [address]  Los precios de los medicamentos varan con frecuencia dependiendo del Environmental consultant de dnde se surte la receta y alguna farmacias pueden ofrecer precios ms baratos.  El sitio web www.goodrx.com tiene cupones para medicamentos de Health and safety inspector. Los precios aqu no tienen en cuenta lo que podra costar con la ayuda del seguro (puede ser ms barato con su seguro), pero el sitio web puede darle el precio si no utiliz Tourist information centre manager.  - Puede imprimir el cupn correspondiente y llevarlo con su receta a la farmacia.  - Tambin puede pasar por nuestra oficina durante el horario de atencin regular y Education officer, museum una tarjeta de cupones de  GoodRx.  - Si necesita que su receta se enve electrnicamente a una farmacia diferente, informe a nuestra oficina a travs de MyChart de [place] o por telfono llamando al [phone] y presione la opcin 4.

## 2023-03-26 LAB — SURGICAL PATHOLOGY

## 2023-03-30 ENCOUNTER — Telehealth: Payer: Self-pay

## 2023-03-30 DIAGNOSIS — C4492 Squamous cell carcinoma of skin, unspecified: Secondary | ICD-10-CM

## 2023-03-30 NOTE — Telephone Encounter (Signed)
Advised pt of bx results.  Discussed mohs surgery and locations.  Patient would like referral sent to [facility]. Referral emailed to Dr. Lorn Junes, [facility]./sh

## 2023-03-30 NOTE — Telephone Encounter (Signed)
-----   Message from Albin sent at 03/27/2023 11:52 AM EST ----- Diagnosis: left cheek zygoma :       WELL DIFFERENTIATED SQUAMOUS CELL CARCINOMA    Please call with diagnosis and determine where the patient would like to have Mohs surgery.  Explanation: This is a squamous cell skin cancer that has grown beyond the surface of the skin and is invading the second layer of the skin. It has the potential to spread beyond the skin and threaten your health, so I recommend treating it.  Treatment: Given the location and type of skin cancer, I recommend Mohs surgery. Mohs surgery involves cutting out the skin cancer and then checking under the microscope to ensure the whole skin cancer was removed. If any skin cancer remains, the surgeon will cut out more until it is fully removed. The cure rate is about 98-99%. Once the Mohs surgeon confirms the skin cancer is out, they will discuss the options to repair or heal the area. You must take it easy for about two weeks after surgery (no lifting over 10-15 lbs, avoid activity to get your heart rate and blood pressure up). It is done at another office outside of [place] ([place], [place], or [place]).

## 2023-04-06 ENCOUNTER — Encounter: Payer: Self-pay | Admitting: Dermatology

## 2023-04-06 ENCOUNTER — Telehealth: Payer: Self-pay

## 2023-04-06 NOTE — Telephone Encounter (Signed)
Lesion was treated with EDC by Dr Gwen Pounds during visit.

## 2023-04-06 NOTE — Telephone Encounter (Signed)
Patient returned call and advised of information. He will keep scheduled appt for August with Dr. Gwen Pounds. aw

## 2023-04-06 NOTE — Telephone Encounter (Signed)
Left pt msg to call the office.  Patient has been scheduled for mohs with Dr. Lorn Junes for [facility] of the L cheek zygoma.  Dr. Gwen Pounds did treat the area at the time of the bx so patient does not need surgery at [facility]./sh

## 2023-04-06 NOTE — Telephone Encounter (Signed)
Called Dr Merritt's office to cancel this patient up coming Mohs appt, SCC at the left Zygoma was removed and treated with [facility] during his office visit here 03/24/23-

## 2023-04-14 ENCOUNTER — Encounter: Payer: Self-pay | Admitting: Dermatology

## 2023-07-09 ENCOUNTER — Ambulatory Visit: Payer: BC Managed Care – PPO | Admitting: Oncology

## 2023-07-09 ENCOUNTER — Other Ambulatory Visit: Payer: BC Managed Care – PPO

## 2023-07-10 ENCOUNTER — Other Ambulatory Visit: Payer: Self-pay | Admitting: *Deleted

## 2023-07-10 DIAGNOSIS — D751 Secondary polycythemia: Secondary | ICD-10-CM

## 2023-07-13 ENCOUNTER — Inpatient Hospital Stay (HOSPITAL_BASED_OUTPATIENT_CLINIC_OR_DEPARTMENT_OTHER): Payer: Self-pay | Admitting: Oncology

## 2023-07-13 ENCOUNTER — Encounter: Payer: Self-pay | Admitting: Oncology

## 2023-07-13 ENCOUNTER — Inpatient Hospital Stay: Payer: Self-pay | Attending: Oncology

## 2023-07-13 VITALS — BP 146/70 | HR 78 | Temp 97.0°F | Resp 16 | Ht 73.0 in | Wt 241.0 lb

## 2023-07-13 DIAGNOSIS — Z79890 Hormone replacement therapy (postmenopausal): Secondary | ICD-10-CM | POA: Insufficient documentation

## 2023-07-13 DIAGNOSIS — D751 Secondary polycythemia: Secondary | ICD-10-CM

## 2023-07-13 LAB — CBC WITH DIFFERENTIAL/PLATELET
Abs Immature Granulocytes: 0.03 10*3/uL (ref 0.00–0.07)
Basophils Absolute: 0.1 10*3/uL (ref 0.0–0.1)
Basophils Relative: 1 %
Eosinophils Absolute: 0.2 10*3/uL (ref 0.0–0.5)
Eosinophils Relative: 2 %
HCT: 52.2 % — ABNORMAL HIGH (ref 39.0–52.0)
Hemoglobin: 17.4 g/dL — ABNORMAL HIGH (ref 13.0–17.0)
Immature Granulocytes: 0 %
Lymphocytes Relative: 27 %
Lymphs Abs: 2.3 10*3/uL (ref 0.7–4.0)
MCH: 31.6 pg (ref 26.0–34.0)
MCHC: 33.3 g/dL (ref 30.0–36.0)
MCV: 94.7 fL (ref 80.0–100.0)
Monocytes Absolute: 0.5 10*3/uL (ref 0.1–1.0)
Monocytes Relative: 6 %
Neutro Abs: 5.6 10*3/uL (ref 1.7–7.7)
Neutrophils Relative %: 64 %
Platelets: 322 10*3/uL (ref 150–400)
RBC: 5.51 MIL/uL (ref 4.22–5.81)
RDW: 14.2 % (ref 11.5–15.5)
WBC: 8.7 10*3/uL (ref 4.0–10.5)
nRBC: 0 % (ref 0.0–0.2)

## 2023-07-13 NOTE — Progress Notes (Signed)
 [facility]  [phone] [phone]  ID: MACLEAN WAS OB: [birth year]  MR#: [id number]  AOZ#:[id number]  Patient Care Team: Lyle San, MD as PCP - General (Family Medicine)  CHIEF COMPLAINT: Secondary polycythemia.  INTERVAL HISTORY: Patient returns to clinic today for repeat laboratory and further evaluation.  He reports he has been donating blood at the [facility] every 2 months.  He continues to take testosterone supplementation.  He currently feels well and is asymptomatic.  He has no neurologic complaints.  He denies any recent fevers or illnesses.  He has a good appetite and denies weight loss.  He has no chest pain, shortness of breath, cough, or hemoptysis.  He denies any nausea, vomiting, constipation, or diarrhea.  He has no urinary complaints.  Patient offers no specific complaints today.  REVIEW OF SYSTEMS:   Review of Systems  Constitutional: Negative.  Negative for fever, malaise/fatigue and weight loss.  Respiratory: Negative.  Negative for cough, hemoptysis and shortness of breath.   Cardiovascular: Negative.  Negative for chest pain and leg swelling.  Gastrointestinal: Negative.  Negative for abdominal pain.  Genitourinary: Negative.  Negative for dysuria.  Musculoskeletal: Negative.  Negative for back pain.  Skin: Negative.  Negative for rash.  Neurological: Negative.  Negative for dizziness, focal weakness, weakness and headaches.  Psychiatric/Behavioral: Negative.  The patient is not nervous/anxious.     As per HPI. Otherwise, a complete review of systems is negative.  PAST MEDICAL HISTORY: Past Medical History:  Diagnosis Date   Actinic keratosis    Arthritis    Gout    Hyperlipidemia    Hypertension    Measles    in childhood   Mumps    in childhood    Psoriasis    Sleep apnea    Squamous cell carcinoma of skin 03/24/2023   left zygoma- treated with EDC   Varicella    in childhood    PAST SURGICAL  HISTORY: Past Surgical History:  Procedure Laterality Date   colon polyps     COLONOSCOPY WITH PROPOFOL  N/A 09/05/2020   Procedure: COLONOSCOPY WITH PROPOFOL ;  Surgeon: Toledo, Alphonsus Jeans, MD;  Location: [facility] ENDOSCOPY;  Service: Gastroenterology;  Laterality: N/A;   CPAP     KNEE SURGERY  [phone]   x6     FAMILY HISTORY: Family History  Problem Relation Age of Onset   Diabetes Mother    Kidney disease Mother    Cancer Father        prostat and skin cancer   Stroke Father     ADVANCED DIRECTIVES (Y/N):  N  HEALTH MAINTENANCE: Social History   Tobacco Use   Smoking status: Never   Smokeless tobacco: Current    Types: Chew  Vaping Use   Vaping status: Never Used  Substance Use Topics   Alcohol use: Yes    Comment: drinks 6-8 beers 3-4x week   Drug use: Never     Colonoscopy:  PAP:  Bone density:  Lipid panel:  No Known Allergies  Current Outpatient Medications  Medication Sig Dispense Refill   Adalimumab-adaz 40 MG/0.4ML SOSY Inject 0.4 mg into the skin.     allopurinol (ZYLOPRIM) 300 MG tablet Take 1 tablet by mouth daily.     aspirin EC 81 MG tablet Take 81 mg by mouth daily.     citalopram (CELEXA) 20 MG tablet Take 20 mg by mouth daily.     ibuprofen (ADVIL) 200 MG tablet Take 200 mg by mouth  every 6 (six) hours as needed.     omega-3 acid ethyl esters (LOVAZA) 1 g capsule Take by mouth 2 (two) times daily.     rosuvastatin (CRESTOR) 10 MG tablet Take 1 tablet by mouth daily.     traZODone (DESYREL) 50 MG tablet Take 50 mg by mouth at bedtime.     metoprolol succinate (TOPROL-XL) 25 MG 24 hr tablet Take 12.5 mg by mouth daily.     pregabalin (LYRICA) 75 MG capsule Take 75 mg by mouth. (Patient not taking: Reported on 07/13/2023)     testosterone cypionate (DEPOTESTOSTERONE CYPIONATE) 200 MG/ML injection Inject 200 mg into the muscle every 14 (fourteen) days.     No current facility-administered medications for this visit.    OBJECTIVE: Vitals:    07/13/23 1334  BP: (!) 146/70  Pulse: 78  Resp: 16  Temp: (!) 97 F (36.1 C)  SpO2: 100%     Body mass index is 31.8 kg/m.    ECOG FS:0 - Asymptomatic  General: Well-developed, well-nourished, no acute distress. Eyes: Pink conjunctiva, anicteric sclera. HEENT: Normocephalic, moist mucous membranes. Lungs: No audible wheezing or coughing. Heart: Regular rate and rhythm. Abdomen: Soft, nontender, no obvious distention. Musculoskeletal: No edema, cyanosis, or clubbing. Neuro: Alert, answering all questions appropriately. Cranial nerves grossly intact. Skin: No rashes or petechiae noted. Psych: Normal affect.   LAB RESULTS:  Lab Results  Component Value Date   NA 135 01/09/2023   K 3.8 01/09/2023   CL 104 01/09/2023   CO2 25 01/09/2023   GLUCOSE 80 01/09/2023   BUN 13 01/09/2023   CREATININE 1.25 (H) 01/09/2023   CALCIUM 8.8 (L) 01/09/2023   PROT 7.2 01/09/2023   ALBUMIN 4.2 01/09/2023   AST 24 01/09/2023   ALT 18 01/09/2023   ALKPHOS 70 01/09/2023   BILITOT 1.0 01/09/2023   GFRNONAA >60 01/09/2023   GFRAA 82 08/17/2017    Lab Results  Component Value Date   WBC 8.7 07/13/2023   NEUTROABS 5.6 07/13/2023   HGB 17.4 (H) 07/13/2023   HCT 52.2 (H) 07/13/2023   MCV 94.7 07/13/2023   PLT 322 07/13/2023     STUDIES: No results found.  ASSESSMENT: Secondary polycythemia.  PLAN:    Secondary polycythemia: Likely due to testosterone injections.  Patient's hemoglobin has improved to 17.4.  Previously, the remainder of his blood work including iron stores, erythropoietin  level, and JAK2 mutation with reflex were either negative or within normal limits.  Patient has been instructed to continue donating blood at the [facility] every 60 days.  No intervention is needed.  Patient does not require additional phlebotomy in clinic.  Return to clinic in 6 months for laboratory work only and then in 1 year for laboratory work and further evaluation.   Low testosterone: Continue  testosterone injections as per primary care.  I spent a total of 20 minutes reviewing chart data, face-to-face evaluation with the patient, counseling and coordination of care as detailed above.   Patient expressed understanding and was in agreement with this plan. He also understands that He can call clinic at any time with any questions, concerns, or complaints.    Shellie Dials, MD   07/13/2023 1:54 PM

## 2023-10-21 ENCOUNTER — Encounter: Payer: Self-pay | Admitting: Dermatology

## 2023-10-21 ENCOUNTER — Ambulatory Visit (INDEPENDENT_AMBULATORY_CARE_PROVIDER_SITE_OTHER): Payer: 59 | Admitting: Dermatology

## 2023-10-21 DIAGNOSIS — C44329 Squamous cell carcinoma of skin of other parts of face: Secondary | ICD-10-CM | POA: Diagnosis not present

## 2023-10-21 DIAGNOSIS — L570 Actinic keratosis: Secondary | ICD-10-CM

## 2023-10-21 DIAGNOSIS — W908XXA Exposure to other nonionizing radiation, initial encounter: Secondary | ICD-10-CM | POA: Diagnosis not present

## 2023-10-21 DIAGNOSIS — Z85828 Personal history of other malignant neoplasm of skin: Secondary | ICD-10-CM

## 2023-10-21 DIAGNOSIS — Z7189 Other specified counseling: Secondary | ICD-10-CM

## 2023-10-21 DIAGNOSIS — Z79899 Other long term (current) drug therapy: Secondary | ICD-10-CM

## 2023-10-21 DIAGNOSIS — L821 Other seborrheic keratosis: Secondary | ICD-10-CM

## 2023-10-21 DIAGNOSIS — Z1283 Encounter for screening for malignant neoplasm of skin: Secondary | ICD-10-CM

## 2023-10-21 DIAGNOSIS — Z8589 Personal history of malignant neoplasm of other organs and systems: Secondary | ICD-10-CM

## 2023-10-21 DIAGNOSIS — D229 Melanocytic nevi, unspecified: Secondary | ICD-10-CM

## 2023-10-21 DIAGNOSIS — C44320 Squamous cell carcinoma of skin of unspecified parts of face: Secondary | ICD-10-CM

## 2023-10-21 DIAGNOSIS — L578 Other skin changes due to chronic exposure to nonionizing radiation: Secondary | ICD-10-CM | POA: Diagnosis not present

## 2023-10-21 DIAGNOSIS — C4492 Squamous cell carcinoma of skin, unspecified: Secondary | ICD-10-CM

## 2023-10-21 DIAGNOSIS — D1801 Hemangioma of skin and subcutaneous tissue: Secondary | ICD-10-CM

## 2023-10-21 DIAGNOSIS — L820 Inflamed seborrheic keratosis: Secondary | ICD-10-CM | POA: Diagnosis not present

## 2023-10-21 DIAGNOSIS — D492 Neoplasm of unspecified behavior of bone, soft tissue, and skin: Secondary | ICD-10-CM

## 2023-10-21 DIAGNOSIS — D485 Neoplasm of uncertain behavior of skin: Secondary | ICD-10-CM | POA: Diagnosis not present

## 2023-10-21 DIAGNOSIS — Z5111 Encounter for antineoplastic chemotherapy: Secondary | ICD-10-CM

## 2023-10-21 DIAGNOSIS — L814 Other melanin hyperpigmentation: Secondary | ICD-10-CM | POA: Diagnosis not present

## 2023-10-21 HISTORY — DX: Squamous cell carcinoma of skin, unspecified: C44.92

## 2023-10-21 NOTE — Patient Instructions (Addendum)
 Starting first week of October- Start 5-fluorouracil/calcipotriene cream twice a day for 7 days to affected areas including forehead and temples for precancerous lesions. Prescription sent to [facility]. Patient advised they will receive an email to purchase the medication online and have it sent to their home. Patient provided with handout reviewing treatment course and side effects and advised to call or message us  on MyChart with any concerns. Starting first week of November Start 5-fluorouracil/calcipotriene cream twice a day for 7 days to affected areas including cheeks and rims of ears.   Reviewed course of treatment and expected reaction.  Patient advised to expect inflammation and crusting and advised that erosions are possible.  Patient advised to be diligent with sun protection during and after treatment. Counseled to keep medication out of reach of children and pets.    Instructions for [facility]  One or more of your medications was sent to the [facility] mail order [facility] pharmacy. You will receive an email from them and can purchase the medicine through that link. It will then be mailed to your home at the address you confirmed. If for any reason you do not receive an email from them, please check your spam folder. If you still do not find the email, please let us  know. [facility] phone number is [phone].   Cryotherapy Aftercare  Wash gently with soap and water everyday.   Apply Vaseline Jelly daily until healed.    Wound Care Instructions  Cleanse wound gently with soap and water once a day then pat dry with clean gauze. Apply a thin coat of Petrolatum (petroleum jelly, Vaseline) over the wound (unless you have an allergy to this). We recommend that you use a new, sterile tube of Vaseline. Do not pick or remove scabs. Do not remove the yellow or white healing tissue from the base of the wound.  Cover the wound with  fresh, clean, nonstick gauze and secure with paper tape. You may use Band-Aids in place of gauze and tape if the wound is small enough, but would recommend trimming much of the tape off as there is often too much. Sometimes Band-Aids can irritate the skin.  You should call the office for your biopsy report after 1 week if you have not already been contacted.  If you experience any problems, such as abnormal amounts of bleeding, swelling, significant bruising, significant pain, or evidence of infection, please call the office immediately.  FOR ADULT SURGERY PATIENTS: If you need something for pain relief you may take 1 extra strength Tylenol (acetaminophen) AND 2 Ibuprofen (200mg  each) together every 4 hours as needed for pain. (do not take these if you are allergic to them or if you have a reason you should not take them.) Typically, you may only need pain medication for 1 to 3 days.     Recommend daily broad spectrum sunscreen SPF 30+ to sun-exposed areas, reapply every 2 hours as needed. Call for new or changing lesions.  Staying in the shade or wearing long sleeves, sun glasses (UVA+UVB protection) and wide brim hats (4-inch brim around the entire circumference of the hat) are also recommended for sun protection.      Due to recent changes in healthcare laws, you may see results of your pathology and/or laboratory studies on MyChart before the doctors have had a chance to review them. We understand that in some cases there may be results that are confusing or concerning to you. Please understand that  not all results are received at the same time and often the doctors may need to interpret multiple results in order to provide you with the best plan of care or course of treatment. Therefore, we ask that you please give us  2 business days to thoroughly review all your results before contacting the office for clarification. Should we see a critical lab result, you will be contacted sooner.   If You  Need Anything After Your Visit  If you have any questions or concerns for your doctor, please call our main line at [phone] and press option 4 to reach your doctor's medical assistant. If no one answers, please leave a voicemail as directed and we will return your call as soon as possible. Messages left after 4 pm will be answered the following business day.   You may also send us  a message via MyChart. We typically respond to MyChart messages within 1-2 business days.  For prescription refills, please ask your pharmacy to contact our office. Our fax number is [phone].  If you have an urgent issue when the clinic is closed that cannot wait until the next business day, you can page your doctor at the number below.    Please note that while we do our best to be available for urgent issues outside of office hours, we are not available 24/7.   If you have an urgent issue and are unable to reach us , you may choose to seek medical care at your doctor's office, retail clinic, urgent care center, or emergency room.  If you have a medical emergency, please immediately call 911 or go to the emergency department.  Pager Numbers  - Dr. Hester: [phone]  - Dr. Jackquline: [phone]  - Dr. Claudene: [phone]   - Dr. Raymund: [phone]  In the event of inclement weather, please call our main line at [phone] for an update on the status of any delays or closures.  Dermatology Medication Tips: Please keep the boxes that topical medications come in in order to help keep track of the instructions about where and how to use these. Pharmacies typically print the medication instructions only on the boxes and not directly on the medication tubes.   If your medication is too expensive, please contact our office at [phone] option 4 or send us  a message through MyChart.   We are unable to tell what your co-pay for medications will be in advance as this is different depending on your  insurance coverage. However, we may be able to find a substitute medication at lower cost or fill out paperwork to get insurance to cover a needed medication.   If a prior authorization is required to get your medication covered by your insurance company, please allow us  1-2 business days to complete this process.  Drug prices often vary depending on where the prescription is filled and some pharmacies may offer cheaper prices.  The website www.goodrx.com contains coupons for medications through different pharmacies. The prices here do not account for what the cost may be with help from insurance (it may be cheaper with your insurance), but the website can give you the price if you did not use any insurance.  - You can print the associated coupon and take it with your prescription to the pharmacy.  - You may also stop by our office during regular business hours and pick up a GoodRx coupon card.  - If you need your prescription sent electronically to a different pharmacy, notify our office through  [facility] MyChart or by phone at [phone] option 4.     Si Usted Necesita Algo Despus de Su Visita  Tambin puede enviarnos un mensaje a travs de Clinical cytogeneticist. Por lo general respondemos a los mensajes de MyChart en el transcurso de 1 a 2 das hbiles.  Para renovar recetas, por favor pida a su farmacia que se ponga en contacto con nuestra oficina. Randi lakes de fax es [place] [phone].  Si tiene un asunto urgente cuando la clnica est cerrada y que no puede esperar hasta el siguiente da hbil, puede llamar/localizar a su doctor(a) al nmero que aparece a continuacin.   Por favor, tenga en cuenta que aunque hacemos todo lo posible para estar disponibles para asuntos urgentes fuera del horario de [place], no estamos disponibles las 24 horas del da, los 7 [address]  [address] de la [place].   Si tiene un problema urgente y no puede comunicarse con nosotros, puede optar por buscar atencin mdica  en el  consultorio de su doctor(a), en una clnica privada, en un centro de atencin urgente o en una sala de emergencias.  Si tiene Engineer, drilling, por favor llame inmediatamente al 911 o vaya a la sala de emergencias.  Nmeros de bper  - Dr. Hester: [phone]  - Dra. Jackquline: [phone]  - Dr. Claudene: [phone]  - Dra. Kitts: [phone]  En caso de inclemencias del [place], por favor llame a nuestra lnea principal al [phone] para una actualizacin sobre el estado de cualquier retraso o cierre.  Consejos para la medicacin en dermatologa: Por favor, guarde las cajas en las que vienen los medicamentos de uso tpico para ayudarle a seguir las instrucciones sobre dnde y cmo usarlos. Las farmacias generalmente imprimen las instrucciones del medicamento slo en las cajas y no directamente en los tubos del [place].   Si su medicamento es muy caro, por favor, pngase en contacto con landry rieger llamando al [phone] y presione la opcin 4 o envenos un mensaje a travs de Clinical cytogeneticist.   No podemos decirle cul ser su copago por los medicamentos por adelantado ya que esto es diferente dependiendo de la cobertura de su seguro. Sin embargo, es posible que podamos encontrar un medicamento sustituto a Audiological scientist un formulario para que el seguro cubra el medicamento que se considera necesario.   Si se requiere una autorizacin previa para que su compaa de seguros malta su medicamento, por favor permtanos de 1 a 2 das hbiles para completar este proceso.  Los precios de los medicamentos varan con frecuencia dependiendo del Environmental consultant de dnde se surte la receta y alguna farmacias pueden ofrecer precios ms baratos.  El sitio web www.goodrx.com tiene cupones para medicamentos de Health and safety inspector. Los precios aqu no tienen en cuenta lo que podra costar con la ayuda del seguro (puede ser ms barato con su seguro), pero el sitio web puede darle el precio si no  utiliz Tourist information centre manager.  - Puede imprimir el cupn correspondiente y llevarlo con su receta a la farmacia.  - Tambin puede pasar por nuestra oficina durante el horario de atencin regular y Education officer, museum una tarjeta de cupones de GoodRx.  - Si necesita que su receta se enve electrnicamente a una farmacia diferente, informe a nuestra oficina a travs de MyChart de [place] o por telfono llamando al [phone] y presione la opcin 4.

## 2023-10-21 NOTE — Progress Notes (Signed)
 Follow-Up Visit   Subjective  Omar Wilson is a 63 y.o. male who presents for the following: Actinic keratosis.  6 month follow up. Face, ears. Tx with [facility] 03/24/2023.  Recheck SCC. Left zygoma. Tx with [facility] 03/24/2023.  The patient presents for Upper Body Skin Exam (UBSE) for skin cancer screening and mole check.  The patient has spots, moles and lesions to be evaluated, some may be new or changing and the patient has concerns that these could be cancer.   The patient has spots, moles and lesions to be evaluated, some may be new or changing and the patient may have concern these could be cancer.  The following portions of the chart were reviewed this encounter and updated as appropriate: medications, allergies, medical history  Review of Systems:  No other skin or systemic complaints except as noted in HPI or Assessment and Plan.  Objective  Well appearing patient in no apparent distress; mood and affect are within normal limits.  All skin waist up examined.   Relevant exam findings are noted in the Assessment and Plan.  face, ears, scalp, arms hands x36 (36) Erythematous thin papules/macules with gritty scale.  Right Prox Mandible 2.5 cm erythematous scaly plaque   arms and hands x5 (5) Erythematous keratotic or waxy stuck-on papule or plaque.  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 EDC 03/24/2023. - No evidence of recurrence today - No lymphadenopathy - Recommend regular full body skin exams - Recommend daily broad spectrum sunscreen SPF 30+ to sun-exposed areas, reapply every 2 hours as needed.  - Call if any new or changing lesions are noted between office visits  AK (ACTINIC KERATOSIS) (36) face, ears, scalp, arms hands x36 (36) ACTINIC DAMAGE WITH PRECANCEROUS ACTINIC KERATOSES Counseling for Topical Chemotherapy Management: Patient exhibits: - Severe, confluent actinic changes with pre-cancerous actinic keratoses that is  secondary to cumulative UV radiation exposure over time - Condition that is severe; chronic, not at goal. - diffuse scaly erythematous macules and papules with underlying dyspigmentation - Discussed Prescription Field Treatment topical Chemotherapy for Severe, Chronic Confluent Actinic Changes with Pre-Cancerous Actinic Keratoses Field treatment involves treatment of an entire area of skin that has confluent Actinic Changes (Sun/ Ultraviolet light damage) and PreCancerous Actinic Keratoses by method of PhotoDynamic Therapy (PDT) and/or prescription Topical Chemotherapy agents such as 5-fluorouracil, 5-fluorouracil/calcipotriene, and/or imiquimod.  The purpose is to decrease the number of clinically evident and subclinical PreCancerous lesions to prevent progression to development of skin cancer by chemically destroying early precancer changes that may or may not be visible.  It has been shown to reduce the risk of developing skin cancer in the treated area. As a result of treatment, redness, scaling, crusting, and open sores may occur during treatment course. One or more than one of these methods may be used and may have to be used several times to control, suppress and eliminate the PreCancerous changes. Discussed treatment course, expected reaction, and possible side effects. - Recommend daily broad spectrum sunscreen SPF 30+ to sun-exposed areas, reapply every 2 hours as needed.  - Staying in the shade or wearing long sleeves, sun glasses (UVA+UVB protection) and wide brim hats (4-inch brim around the entire circumference of the hat) are also recommended. - Call for new or changing lesions. Actinic keratoses are precancerous spots that appear secondary to cumulative UV radiation exposure/sun exposure over time. They are chronic with expected duration over 1 year. A portion of actinic  keratoses will progress to squamous cell carcinoma of the skin. It is not possible to reliably predict which spots will  progress to skin cancer and so treatment is recommended to prevent development of skin cancer.  Recommend daily broad spectrum sunscreen SPF 30+ to sun-exposed areas, reapply every 2 hours as needed.  Recommend staying in the shade or wearing long sleeves, sun glasses (UVA+UVB protection) and wide brim hats (4-inch brim around the entire circumference of the hat). Call for new or changing lesions.  Starting first week of October- Start 5-fluorouracil/calcipotriene cream twice a day for 7 days to affected areas including forehead and temples for precancerous lesions. Prescription sent to [facility]. Patient advised they will receive an email to purchase the medication online and have it sent to their home. Patient provided with handout reviewing treatment course and side effects and advised to call or message us  on MyChart with any concerns. Starting first week of November Start 5-fluorouracil/calcipotriene cream twice a day for 7 days to affected areas including cheeks and rims of ears.   Reviewed course of treatment and expected reaction.  Patient advised to expect inflammation and crusting and advised that erosions are possible.  Patient advised to be diligent with sun protection during and after treatment. Counseled to keep medication out of reach of children and pets.   Destruction of lesion - face, ears, scalp, arms hands x36 (36) Complexity: simple   Destruction method: cryotherapy   Informed consent: discussed and consent obtained   Timeout:  patient name, date of birth, surgical site, and procedure verified Lesion destroyed using liquid nitrogen: Yes   Region frozen until ice ball extended beyond lesion: Yes   Outcome: patient tolerated procedure well with no complications   Post-procedure details: wound care instructions given   Additional details:  Prior to procedure, discussed risks of blister formation, small wound, skin dyspigmentation, or rare scar  following cryotherapy. Recommend Vaseline ointment to treated areas while healing.   NEOPLASM OF SKIN Right Prox Mandible Epidermal / dermal shaving  Lesion diameter (cm):  2.5 Informed consent: discussed and consent obtained   Timeout: patient name, date of birth, surgical site, and procedure verified   Procedure prep:  Patient was prepped and draped in usual sterile fashion Prep type:  Isopropyl alcohol Anesthesia: the lesion was anesthetized in a standard fashion   Anesthetic:  1% lidocaine  w/ epinephrine 1-100,000 buffered w/ 8.4% NaHCO3 Instrument used: flexible razor blade   Hemostasis achieved with: pressure, aluminum chloride and electrodesiccation   Outcome: patient tolerated procedure well   Post-procedure details: sterile dressing applied and wound care instructions given   Dressing type: bandage and petrolatum    Destruction of lesion Complexity: extensive   Destruction method: electrodesiccation and curettage   Informed consent: discussed and consent obtained   Timeout:  patient name, date of birth, surgical site, and procedure verified Procedure prep:  Patient was prepped and draped in usual sterile fashion Prep type:  Isopropyl alcohol Anesthesia: the lesion was anesthetized in a standard fashion   Anesthetic:  1% lidocaine  w/ epinephrine 1-100,000 buffered w/ 8.4% NaHCO3 Curettage performed in three different directions: Yes   Electrodesiccation performed over the curetted area: Yes   Curettage cycles:  3 Final wound size (cm):  2.5 Hemostasis achieved with:  pressure and aluminum chloride Outcome: patient tolerated procedure well with no complications   Post-procedure details: sterile dressing applied and wound care instructions given   Dressing type: bandage and petrolatum    Specimen 1 -  Surgical pathology Differential Diagnosis: Hypertrophic AK, R/O SCC  Check Margins: No  EDC today INFLAMED SEBORRHEIC KERATOSIS (5) arms and hands x5 (5) Symptomatic,  irritating, patient would like treated. Destruction of lesion - arms and hands x5 (5) Complexity: simple   Destruction method: cryotherapy   Informed consent: discussed and consent obtained   Timeout:  patient name, date of birth, surgical site, and procedure verified Lesion destroyed using liquid nitrogen: Yes   Region frozen until ice ball extended beyond lesion: Yes   Outcome: patient tolerated procedure well with no complications   Post-procedure details: wound care instructions given   Additional details:  Prior to procedure, discussed risks of blister formation, small wound, skin dyspigmentation, or rare scar following cryotherapy. Recommend Vaseline ointment to treated areas while healing.   Destruction of lesion - arms and hands x5 (5) SKIN CANCER SCREENING   HISTORY OF BASAL CELL CARCINOMA   ACTINIC SKIN DAMAGE   LENTIGO   MELANOCYTIC NEVUS, UNSPECIFIED LOCATION   SEBORRHEIC KERATOSIS   CHEMOTHERAPY MANAGEMENT, ENCOUNTER FOR   COUNSELING AND COORDINATION OF CARE   MEDICATION MANAGEMENT   SCC (SQUAMOUS CELL CARCINOMA), FACE    Skin Cancer Screening   LENTIGINES Exam: scattered tan macules Due to sun exposure Treatment Plan: Benign-appearing, observe. Recommend daily broad spectrum sunscreen SPF 30+ to sun-exposed areas, reapply every 2 hours as needed.  Call for any changes  SEBORRHEIC KERATOSIS - Stuck-on, waxy, tan-brown papules and/or plaques  - Benign-appearing - Discussed benign etiology and prognosis. - Observe - Call for any changes  MELANOCYTIC NEVI Exam: Tan-brown and/or pink-flesh-colored symmetric macules and papules Treatment Plan: Benign appearing on exam today. Recommend observation. Call clinic for new or changing moles. Recommend daily use of broad spectrum spf 30+ sunscreen to sun-exposed areas.   Return in about 6 months (around 04/22/2024) for AK Follow Up, Biopsy Follow Up.  I, Kate Fought, CMA, am acting as scribe for  Alm Rhyme, MD.   Documentation: I have reviewed the above documentation for accuracy and completeness, and I agree with the above.  Alm Rhyme, MD

## 2023-10-22 LAB — SURGICAL PATHOLOGY

## 2023-10-26 ENCOUNTER — Encounter: Payer: Self-pay | Admitting: Dermatology

## 2023-10-26 ENCOUNTER — Ambulatory Visit: Payer: Self-pay | Admitting: Dermatology

## 2023-10-26 NOTE — Telephone Encounter (Addendum)
 Tried calling patient regarding bx results. No answer. LM for patient to return call.  ----- Message from Alm Rhyme sent at 10/26/2023  5:00 PM EDT ----- FINAL DIAGNOSIS        1. Skin, right prox mandible :       WELL DIFFERENTIATED SQUAMOUS CELL CARCINOMA WITH SUPERFICIAL INFILTRATION   Cancer = SCC Well differentiated Already treated Recheck next visit ----- Message ----- From: Interface, Lab In Three Zero One Sent: 10/22/2023   6:31 PM EDT To: Alm JAYSON Rhyme, MD

## 2023-10-28 ENCOUNTER — Encounter: Payer: Self-pay | Admitting: Dermatology

## 2023-10-28 NOTE — Telephone Encounter (Signed)
 Patient left voicemail returning phone call, spoke with patient and advised of results. Patient voiced understanding to all.

## 2024-01-13 ENCOUNTER — Inpatient Hospital Stay: Attending: Oncology

## 2024-01-13 DIAGNOSIS — D751 Secondary polycythemia: Secondary | ICD-10-CM | POA: Insufficient documentation

## 2024-01-13 LAB — CBC WITH DIFFERENTIAL/PLATELET
Abs Immature Granulocytes: 0.03 K/uL (ref 0.00–0.07)
Basophils Absolute: 0.1 K/uL (ref 0.0–0.1)
Basophils Relative: 1 %
Eosinophils Absolute: 0.3 K/uL (ref 0.0–0.5)
Eosinophils Relative: 4 %
HCT: 50.9 % (ref 39.0–52.0)
Hemoglobin: 17.1 g/dL — ABNORMAL HIGH (ref 13.0–17.0)
Immature Granulocytes: 0 %
Lymphocytes Relative: 30 %
Lymphs Abs: 2.2 K/uL (ref 0.7–4.0)
MCH: 30.9 pg (ref 26.0–34.0)
MCHC: 33.6 g/dL (ref 30.0–36.0)
MCV: 92 fL (ref 80.0–100.0)
Monocytes Absolute: 0.6 K/uL (ref 0.1–1.0)
Monocytes Relative: 7 %
Neutro Abs: 4.4 K/uL (ref 1.7–7.7)
Neutrophils Relative %: 58 %
Platelets: 345 K/uL (ref 150–400)
RBC: 5.53 MIL/uL (ref 4.22–5.81)
RDW: 14.7 % (ref 11.5–15.5)
WBC: 7.6 K/uL (ref 4.0–10.5)
nRBC: 0 % (ref 0.0–0.2)

## 2024-03-28 ENCOUNTER — Encounter

## 2024-04-25 ENCOUNTER — Encounter

## 2024-04-26 ENCOUNTER — Ambulatory Visit: Admitting: Dermatology

## 2024-07-12 ENCOUNTER — Ambulatory Visit: Admitting: Oncology

## 2024-07-12 ENCOUNTER — Other Ambulatory Visit
